# Patient Record
Sex: Female | Born: 1965
Health system: Southern US, Community
[De-identification: ages and names within clinical notes are randomized; demographics above are authoritative.]

## PROBLEM LIST (undated history)

## (undated) DIAGNOSIS — K219 Gastro-esophageal reflux disease without esophagitis: Secondary | ICD-10-CM

## (undated) DIAGNOSIS — M2550 Pain in unspecified joint: Secondary | ICD-10-CM

## (undated) DIAGNOSIS — B019 Varicella without complication: Secondary | ICD-10-CM

## (undated) DIAGNOSIS — J349 Unspecified disorder of nose and nasal sinuses: Secondary | ICD-10-CM

## (undated) DIAGNOSIS — G4733 Obstructive sleep apnea (adult) (pediatric): Secondary | ICD-10-CM

## (undated) DIAGNOSIS — M549 Dorsalgia, unspecified: Secondary | ICD-10-CM

## (undated) HISTORY — DX: Dorsalgia, unspecified: M54.9

## (undated) HISTORY — DX: Pain in unspecified joint: M25.50

## (undated) HISTORY — DX: Unspecified disorder of nose and nasal sinuses: J34.9

## (undated) HISTORY — PX: WRIST FRACTURE SURGERY: SHX121

## (undated) HISTORY — DX: Gastro-esophageal reflux disease without esophagitis: K21.9

## (undated) HISTORY — PX: BACK SURGERY: SHX140

## (undated) HISTORY — DX: Obstructive sleep apnea (adult) (pediatric): G47.33

## (undated) HISTORY — DX: Varicella without complication: B01.9

---

## 2010-11-27 ENCOUNTER — Encounter
Admission: RE | Admit: 2010-11-27 | Discharge: 2010-11-27 | Payer: Self-pay | Source: Home / Self Care | Attending: Family Medicine | Admitting: Family Medicine

## 2011-06-19 ENCOUNTER — Encounter (HOSPITAL_COMMUNITY)
Admission: RE | Admit: 2011-06-19 | Discharge: 2011-06-19 | Disposition: A | Discharge status: Home / Self Care | Payer: Federal, State, Local not specified - PPO | Source: Ambulatory Visit | Attending: Obstetrics and Gynecology | Admitting: Obstetrics and Gynecology

## 2011-06-19 ENCOUNTER — Encounter (HOSPITAL_COMMUNITY): Payer: Self-pay

## 2011-06-19 LAB — CBC
HCT: 35.4 % — ABNORMAL LOW (ref 36.0–46.0)
MCV: 85.1 fL (ref 78.0–100.0)
Platelets: 228 10*3/uL (ref 150–400)
RBC: 4.16 MIL/uL (ref 3.87–5.11)
WBC: 6.4 10*3/uL (ref 4.0–10.5)

## 2011-06-19 LAB — SURGICAL PCR SCREEN: Staphylococcus aureus: NEGATIVE

## 2011-06-19 NOTE — Patient Instructions (Signed)
18 Victoria Caldwell  06/19/2011   Your procedure is scheduled on:  06/26/11  Report to [facility] at 0800 AM.  Call this number if you have problems the morning of surgery: [phone]   Remember:   Do not eat food:After Midnight.  Do not drink clear liquids: After Midnight.  Take these medicines the morning of surgery with A SIP OF WATER: none   Do not wear jewelry, make-up or nail polish.  Do not wear lotions, powders, or perfumes. You may wear deodorant.  Do not shave 48 hours prior to surgery.  Do not bring valuables to the hospital.  Contacts, dentures or bridgework may not be worn into surgery.  Leave suitcase in the car. After surgery it may be brought to your room.  For patients admitted to the hospital, checkout time is 11:00 AM the day of discharge.   Patients discharged the day of surgery will not be allowed to drive home.  Name and phone number of your driver: [phone]  Special Instructions: CHG Shower Use Special Wash: 1/2 bottle night before surgery and 1/2 bottle morning of surgery.   Please read over the following fact sheets that you were given:

## 2011-06-25 MED ORDER — CEFAZOLIN SODIUM-DEXTROSE 2-3 GM-% IV SOLR
2.0000 g | INTRAVENOUS | Status: AC
  Administered 2011-06-26: 2 g via INTRAVENOUS
  Filled 2011-06-25: qty 50

## 2011-06-25 NOTE — H&P (Addendum)
Victoria Caldwell is an 45 y.o. female. G3P3 with a long h/o menorrhagia and dysmenorrhea.  Workup revealed a uterus with a large submucosal fibroid (4 cm) and a normal endometrial biopsy.  Pt wishes for definitive surgical therapy with a hysterectomy.  She is not currently sexually active and has never had any issues with abnormal pap smears.  Her periods are regular every 25-28 days and last 6 days with 3 of those severe enough to require narcotic relief and Aleve.  Pertinent Gynecological History: Last mammogram: normal Date: 2012 Last pap: normal Date: 2012 OB History: G3, P3 NSVD x 3     Past Medical History  Diagnosis Date  . No pertinent past medical history     Past Surgical History  Procedure Date  . Back surgery      Social History:  reports that she has never smoked. She does not have any smokeless tobacco history on file. She reports that she drinks about .6 ounces of alcohol per week. She reports that she does not use illicit drugs.  Allergies: No Known Allergies  No prescriptions prior to admission    ROS  There were no vitals taken for this visit. Physical Exam  Constitutional: She is oriented to person, place, and time. She appears well-developed.  Cardiovascular: Normal rate and regular rhythm.   Respiratory: Effort normal and breath sounds normal.  GI: Soft. Bowel sounds are normal.  Genitourinary: Vagina normal.       Uterus ULN adnexa without masses  Neurological: She is alert and oriented to person, place, and time.  Psychiatric: She has a normal mood and affect.      Assessment/Plan: The pt was counselled re: options and would like to proceed with an LSH with TAH in the event of complications.  We discused risks of bleeding, infection and possible damage to bowel and bladder.  She understands in the event of a complication she would need a larger incision and possible have a longer recovery.  She will do a bowel prep the night prior to surgery.  Pt  desires to proceed.  Oliver Pila 06/25/2011, 10:19 PM  Per pt no changes in dictated H&P except she has decided she definitively wants removal of her ovaries due to a h/o cancer in her mother.  She realizes this will make her menopausal and is prepared for that.  We have added this to the consent form.

## 2011-06-26 ENCOUNTER — Ambulatory Visit (HOSPITAL_COMMUNITY)
Admission: RE | Admit: 2011-06-26 | Discharge: 2011-06-27 | Disposition: A | Discharge status: Home / Self Care | Payer: Federal, State, Local not specified - PPO | Source: Ambulatory Visit | Attending: Obstetrics and Gynecology | Admitting: Obstetrics and Gynecology

## 2011-06-26 ENCOUNTER — Encounter (HOSPITAL_COMMUNITY)
Admission: RE | Disposition: A | Discharge status: Home / Self Care | Payer: Self-pay | Source: Ambulatory Visit | Attending: Obstetrics and Gynecology

## 2011-06-26 ENCOUNTER — Encounter (HOSPITAL_COMMUNITY): Payer: Self-pay | Admitting: *Deleted

## 2011-06-26 ENCOUNTER — Encounter (HOSPITAL_COMMUNITY): Payer: Self-pay | Admitting: Registered Nurse

## 2011-06-26 ENCOUNTER — Other Ambulatory Visit: Payer: Self-pay | Admitting: Obstetrics and Gynecology

## 2011-06-26 ENCOUNTER — Ambulatory Visit (HOSPITAL_COMMUNITY): Payer: Federal, State, Local not specified - PPO | Admitting: Registered Nurse

## 2011-06-26 DIAGNOSIS — N8300 Follicular cyst of ovary, unspecified side: Secondary | ICD-10-CM | POA: Insufficient documentation

## 2011-06-26 DIAGNOSIS — Z01812 Encounter for preprocedural laboratory examination: Secondary | ICD-10-CM | POA: Insufficient documentation

## 2011-06-26 DIAGNOSIS — N949 Unspecified condition associated with female genital organs and menstrual cycle: Secondary | ICD-10-CM | POA: Insufficient documentation

## 2011-06-26 DIAGNOSIS — N946 Dysmenorrhea, unspecified: Secondary | ICD-10-CM | POA: Insufficient documentation

## 2011-06-26 DIAGNOSIS — Z90710 Acquired absence of both cervix and uterus: Secondary | ICD-10-CM

## 2011-06-26 DIAGNOSIS — D259 Leiomyoma of uterus, unspecified: Secondary | ICD-10-CM | POA: Insufficient documentation

## 2011-06-26 DIAGNOSIS — Z01818 Encounter for other preprocedural examination: Secondary | ICD-10-CM | POA: Insufficient documentation

## 2011-06-26 DIAGNOSIS — N938 Other specified abnormal uterine and vaginal bleeding: Secondary | ICD-10-CM | POA: Insufficient documentation

## 2011-06-26 HISTORY — PX: LAPAROSCOPIC SUPRACERVICAL HYSTERECTOMY: SHX5399

## 2011-06-26 HISTORY — PX: SALPINGOOPHORECTOMY: SHX82

## 2011-06-26 LAB — TYPE AND SCREEN
ABO/RH(D): O POS
Antibody Screen: NEGATIVE

## 2011-06-26 SURGERY — HYSTERECTOMY, SUPRACERVICAL, LAPAROSCOPIC
Anesthesia: General | Site: Uterus | Wound class: Clean Contaminated

## 2011-06-26 MED ORDER — ALUM & MAG HYDROXIDE-SIMETH 200-200-20 MG/5ML PO SUSP: 30.0000 mL | ORAL | Status: DC | PRN

## 2011-06-26 MED ORDER — OXYCODONE-ACETAMINOPHEN 5-325 MG PO TABS: 1.0000 | ORAL_TABLET | ORAL | Status: DC | PRN

## 2011-06-26 MED ORDER — MEPERIDINE HCL 25 MG/ML IJ SOLN: 6.2500 mg | INTRAMUSCULAR | Status: DC | PRN

## 2011-06-26 MED ORDER — LIDOCAINE HCL (CARDIAC) 20 MG/ML IV SOLN
INTRAVENOUS | Status: DC | PRN
  Administered 2011-06-26: 80 mg via INTRAVENOUS

## 2011-06-26 MED ORDER — ROCURONIUM BROMIDE 50 MG/5ML IV SOLN
INTRAVENOUS | Status: AC
  Filled 2011-06-26: qty 1

## 2011-06-26 MED ORDER — MORPHINE SULFATE 10 MG/ML IJ SOLN
INTRAMUSCULAR | Status: AC
  Filled 2011-06-26: qty 1

## 2011-06-26 MED ORDER — LIDOCAINE HCL (CARDIAC) 20 MG/ML IV SOLN
INTRAVENOUS | Status: AC
  Filled 2011-06-26: qty 5

## 2011-06-26 MED ORDER — SODIUM CHLORIDE 0.9 % IJ SOLN
INTRAMUSCULAR | Status: DC | PRN
  Administered 2011-06-26: 10 mL

## 2011-06-26 MED ORDER — KETOROLAC TROMETHAMINE 30 MG/ML IJ SOLN
30.0000 mg | Freq: Four times a day (QID) | INTRAMUSCULAR | Status: AC
  Administered 2011-06-26 (×2): 30 mg via INTRAVENOUS
  Filled 2011-06-26 (×3): qty 1

## 2011-06-26 MED ORDER — ONDANSETRON HCL 4 MG/2ML IJ SOLN: 4.0000 mg | Freq: Once | INTRAMUSCULAR | Status: DC | PRN

## 2011-06-26 MED ORDER — MORPHINE SULFATE 10 MG/ML IJ SOLN
INTRAMUSCULAR | Status: DC | PRN
  Administered 2011-06-26 (×2): 5 mg via INTRAVENOUS

## 2011-06-26 MED ORDER — BISACODYL 10 MG RE SUPP: 10.0000 mg | Freq: Every day | RECTAL | Status: DC | PRN

## 2011-06-26 MED ORDER — DOCUSATE SODIUM 100 MG PO CAPS
100.0000 mg | ORAL_CAPSULE | Freq: Every day | ORAL | Status: DC
  Administered 2011-06-27: 100 mg via ORAL
  Filled 2011-06-26: qty 1

## 2011-06-26 MED ORDER — ONDANSETRON HCL 4 MG/2ML IJ SOLN
INTRAMUSCULAR | Status: DC | PRN
  Administered 2011-06-26: 4 mg via INTRAVENOUS

## 2011-06-26 MED ORDER — DEXTROSE-NACL 5-0.45 % IV SOLN
INTRAVENOUS | Status: DC
  Administered 2011-06-26 – 2011-06-27 (×2): via INTRAVENOUS

## 2011-06-26 MED ORDER — ONDANSETRON HCL 4 MG PO TABS: 4.0000 mg | ORAL_TABLET | Freq: Four times a day (QID) | ORAL | Status: DC | PRN

## 2011-06-26 MED ORDER — LACTATED RINGERS IV SOLN
INTRAVENOUS | Status: DC
  Administered 2011-06-26: 10:00:00 via INTRAVENOUS
  Administered 2011-06-26: 1000 mL via INTRAVENOUS
  Administered 2011-06-26 (×2): via INTRAVENOUS

## 2011-06-26 MED ORDER — KETOROLAC TROMETHAMINE 30 MG/ML IJ SOLN
INTRAMUSCULAR | Status: DC | PRN
  Administered 2011-06-26: 30 mg via INTRAVENOUS

## 2011-06-26 MED ORDER — ONDANSETRON HCL 4 MG/2ML IJ SOLN
INTRAMUSCULAR | Status: AC
  Filled 2011-06-26: qty 2

## 2011-06-26 MED ORDER — MIDAZOLAM HCL 2 MG/2ML IJ SOLN
INTRAMUSCULAR | Status: AC
Start: 2011-06-26 — End: 2011-06-26
  Filled 2011-06-26: qty 2

## 2011-06-26 MED ORDER — GLYCOPYRROLATE 0.2 MG/ML IJ SOLN
INTRAMUSCULAR | Status: AC
  Filled 2011-06-26: qty 1

## 2011-06-26 MED ORDER — GLYCOPYRROLATE 0.2 MG/ML IJ SOLN
INTRAMUSCULAR | Status: DC | PRN
  Administered 2011-06-26 (×2): 0.2 mg via INTRAVENOUS

## 2011-06-26 MED ORDER — FENTANYL CITRATE 0.05 MG/ML IJ SOLN
INTRAMUSCULAR | Status: AC
  Filled 2011-06-26: qty 5

## 2011-06-26 MED ORDER — GLYCOPYRROLATE 0.2 MG/ML IJ SOLN
INTRAMUSCULAR | Status: AC
  Administered 2011-06-26: 0.2 mg via INTRAVENOUS
  Filled 2011-06-26: qty 1

## 2011-06-26 MED ORDER — DEXAMETHASONE SODIUM PHOSPHATE 10 MG/ML IJ SOLN
INTRAMUSCULAR | Status: DC | PRN
  Administered 2011-06-26: 10 mg via INTRAVENOUS

## 2011-06-26 MED ORDER — NEOSTIGMINE METHYLSULFATE 1 MG/ML IJ SOLN
INTRAMUSCULAR | Status: DC | PRN
  Administered 2011-06-26: 1.5 mg via INTRAMUSCULAR

## 2011-06-26 MED ORDER — PROPOFOL 10 MG/ML IV EMUL
INTRAVENOUS | Status: DC | PRN
  Administered 2011-06-26: 200 mg via INTRAVENOUS

## 2011-06-26 MED ORDER — ONDANSETRON HCL 4 MG/2ML IJ SOLN
4.0000 mg | Freq: Four times a day (QID) | INTRAMUSCULAR | Status: DC | PRN
  Administered 2011-06-26 – 2011-06-27 (×3): 4 mg via INTRAVENOUS
  Filled 2011-06-26 (×3): qty 2

## 2011-06-26 MED ORDER — ROCURONIUM BROMIDE 100 MG/10ML IV SOLN
INTRAVENOUS | Status: DC | PRN
  Administered 2011-06-26: 50 mg via INTRAVENOUS

## 2011-06-26 MED ORDER — HYDROMORPHONE HCL 1 MG/ML IJ SOLN: 0.2500 mg | INTRAMUSCULAR | Status: DC | PRN

## 2011-06-26 MED ORDER — SIMETHICONE 80 MG PO CHEW: 80.0000 mg | CHEWABLE_TABLET | Freq: Four times a day (QID) | ORAL | Status: DC | PRN

## 2011-06-26 MED ORDER — MIDAZOLAM HCL 5 MG/5ML IJ SOLN
INTRAMUSCULAR | Status: DC | PRN
  Administered 2011-06-26: 2 mg via INTRAVENOUS

## 2011-06-26 MED ORDER — PROPOFOL 10 MG/ML IV EMUL
INTRAVENOUS | Status: AC
Start: 2011-06-26 — End: 2011-06-26
  Filled 2011-06-26: qty 20

## 2011-06-26 MED ORDER — FENTANYL CITRATE 0.05 MG/ML IJ SOLN
INTRAMUSCULAR | Status: DC | PRN
  Administered 2011-06-26: 100 ug via INTRAVENOUS
  Administered 2011-06-26 (×4): 50 ug via INTRAVENOUS

## 2011-06-26 MED ORDER — IBUPROFEN 600 MG PO TABS: 600.0000 mg | ORAL_TABLET | Freq: Four times a day (QID) | ORAL | Status: DC | PRN

## 2011-06-26 MED ORDER — BUPIVACAINE HCL (PF) 0.25 % IJ SOLN
INTRAMUSCULAR | Status: DC | PRN
  Administered 2011-06-26: 10 mL

## 2011-06-26 MED ORDER — NEOSTIGMINE METHYLSULFATE 1 MG/ML IJ SOLN
INTRAMUSCULAR | Status: AC
  Filled 2011-06-26: qty 10

## 2011-06-26 MED ORDER — HYDROMORPHONE HCL 1 MG/ML IJ SOLN: 1.0000 mg | INTRAMUSCULAR | Status: DC | PRN

## 2011-06-26 MED ORDER — GLYCOPYRROLATE 0.2 MG/ML IJ SOLN
0.2000 mg | Freq: Once | INTRAMUSCULAR | Status: AC
  Administered 2011-06-26: 0.2 mg via INTRAVENOUS

## 2011-06-26 MED ORDER — KETOROLAC TROMETHAMINE 30 MG/ML IJ SOLN: 30.0000 mg | Freq: Four times a day (QID) | INTRAMUSCULAR | Status: AC

## 2011-06-26 MED ORDER — KETOROLAC TROMETHAMINE 30 MG/ML IJ SOLN
INTRAMUSCULAR | Status: AC
  Filled 2011-06-26: qty 1

## 2011-06-26 MED ORDER — KETOROLAC TROMETHAMINE 30 MG/ML IJ SOLN: 15.0000 mg | Freq: Once | INTRAMUSCULAR | Status: DC | PRN

## 2011-06-26 MED ORDER — LACTATED RINGERS IR SOLN
Status: DC | PRN
  Administered 2011-06-26: 3000 mL

## 2011-06-26 MED ORDER — DEXAMETHASONE SODIUM PHOSPHATE 10 MG/ML IJ SOLN
INTRAMUSCULAR | Status: AC
  Filled 2011-06-26: qty 1

## 2011-06-26 SURGICAL SUPPLY — 30 items
BARRIER ADHS 3X4 INTERCEED (GAUZE/BANDAGES/DRESSINGS) ×3 IMPLANT
BLADE LAPAROSCOPIC MORCELL KIT (BLADE) ×6 IMPLANT
BLADE SURG 10 STRL SS (BLADE) ×3 IMPLANT
CHLORAPREP W/TINT 26ML (MISCELLANEOUS) ×3 IMPLANT
CLOTH BEACON ORANGE TIMEOUT ST (SAFETY) ×3 IMPLANT
COVER MAYO STAND STRL (DRAPES) ×3 IMPLANT
DERMABOND ADVANCED (GAUZE/BANDAGES/DRESSINGS) ×3 IMPLANT
DRAPE UTILITY XL STRL (DRAPES) ×3 IMPLANT
EVACUATOR SMOKE 8.L (FILTER) ×6 IMPLANT
GLOVE BIO SURGEON STRL SZ 6.5 (GLOVE) ×6 IMPLANT
GLOVE BIO SURGEON STRL SZ8 (GLOVE) ×3 IMPLANT
GLOVE ORTHO TXT STRL SZ7.5 (GLOVE) ×3 IMPLANT
GOWN PREVENTION PLUS LG XLONG (DISPOSABLE) ×6 IMPLANT
GOWN PREVENTION PLUS XLARGE (GOWN DISPOSABLE) ×3 IMPLANT
NEEDLE INSUFFLATION 14GA 120MM (NEEDLE) ×3 IMPLANT
NS IRRIG 1000ML POUR BTL (IV SOLUTION) ×3 IMPLANT
PACK LAPAROSCOPY BASIN (CUSTOM PROCEDURE TRAY) ×3 IMPLANT
SCALPEL HARMONIC ACE (MISCELLANEOUS) ×3 IMPLANT
SET IRRIG TUBING LAPAROSCOPIC (IRRIGATION / IRRIGATOR) ×3 IMPLANT
SLEEVE Z-THREAD 5X100MM (TROCAR) ×3 IMPLANT
SPONGE LAP 18X18 X RAY DECT (DISPOSABLE) ×3 IMPLANT
SUT VIC AB 3-0 PS2 18 (SUTURE) ×1
SUT VIC AB 3-0 PS2 18XBRD (SUTURE) ×2 IMPLANT
SUT VICRYL 0 UR6 27IN ABS (SUTURE) ×3 IMPLANT
TOWEL OR 17X24 6PK STRL BLUE (TOWEL DISPOSABLE) ×6 IMPLANT
TRAY FOLEY CATH 14FR (SET/KITS/TRAYS/PACK) ×3 IMPLANT
TROCAR Z-THREAD FIOS 12X100MM (TROCAR) ×3 IMPLANT
TROCAR Z-THREAD FIOS 5X100MM (TROCAR) ×3 IMPLANT
WARMER LAPAROSCOPE (MISCELLANEOUS) ×3 IMPLANT
WATER STERILE IRR 1000ML POUR (IV SOLUTION) ×3 IMPLANT

## 2011-06-26 NOTE — Op Note (Signed)
Operative note  Preop diagnosis #1 fibroid uterus #2 dysmenorrhea #3 abnormal uterine bleeding  Postop diagnosis Same  Procedure Laparoscopic supracervical hysterectomy and bilateral salpingo-oophorectomy  Surgeon Dr. Huel Cote Dr. Tawanna Cooler Meisinger  Anesthesia Gen.  Fluids Estimated blood loss 300 cc Urine output 150 cc clear urine IV fluid 2200 cc LR  Findings The uterus was enlarged had a 10-12 week size with a large submucosal fibroid. Tubes and ovaries appeared normal. The remainder of the abdominal and pelvic anatomy appeared normal.  Specimen Morcellated uterus ovaries and tubes sent to pathology  Procedure note After informed consent was obtained from the patient she was taken to the operating room and general anesthesia obtained without difficulty. An appropriate time out was performed, and the patient was prepped and draped in the normal sterile fashion in the dorsal lithotomy position with her arms touch by her sides. With a Foley catheter in place,  attention was turned to the infraumbilical area which was injected with quarter percent Marcaine. A small incision was then made with the scalpel and the varies needle introduced into the peritoneal cavity. Intraperitoneal placement was confirmed with aspiration injection with normal saline and the gas flow was then applied with a normal opening pressure of 4 noted. Pneumoperitoneum was obtained with approximately 3 L of CO2 gas. The varies needle was then removed and a 5 mm trocar Opti-Vu utilized to enter the peritoneal cavity. 2 additional trochars were then placed under direct visualization a 5 mm in the upper right quadrant and a 12 mm in the upper left quadrant. With these in place the infundibulopelvic ligament was then taken down bilaterally with harmonic scalpel. This was continued down through the broad ligament and the round ligament as well with a bladder flap developed with harmonic scalpel. The uterine artery  on the patient's left was then transected with some brisk bleeding noted but ultimately controlled with Kleppinger cautery and the harmonic scalpel. The right uterine artery was likewise transected with a harmonic scalpel. When all appeared hemostatic the harmonic scalpel was utilized to come across the cervical stump. The uterine fundus was therefore completely amputated from the underlying cervical stump. There is no bleeding noted at this juncture and the 12 mm trocar was removed with the Gynecare morcellator placed within this incision. The uterus was then morcellated and removed as well as the tubes and ovaries. The abdominal cavity and pelvis were carefully inspected and any remaining fragments removed with the laparoscopic spoon. All pedicles and the cervical stump were irrigated with no active bleeding noted. The pressure was taken down to 4 with still no bleeding noted. Therefore Interceed was placed over the cervical stump, and all instruments removed from the ports. The trochars were then removed under direct visualization with no active bleeding noted. The 12 mm was removed first and direct laparoscopic visualization performed wall its port site was closed at the fascial level with 0 Vicryl. The remainder of the incisions were closed with 3-0 Vicryl in a subcuticular stitch and Dermabond on the skin. At the conclusion of the procedure all instrument and sponge counts were correct and the patient was taken to the recovery room in good condition.

## 2011-06-26 NOTE — Brief Op Note (Signed)
06/26/2011  11:22 AM  PATIENT:  Victoria Caldwell  45 y.o. female  PRE-OPERATIVE DIAGNOSIS:  Bleeding; Fibroids, Dysmenorrhea POST-OPERATIVE DIAGNOSIS: same PROCEDURE:  Procedure(s): LAPAROSCOPIC SUPRACERVICAL HYSTERECTOMY BILATERAL SALPINGO OOPHERECTOMY  SURGEON:  Surgeon(s): Milagros Reap D Meisinger   ANESTHESIA:   local and general  ESTIMATED BLOOD LOSS: 350cc  BLOOD ADMINISTERED:none  LOCAL MEDICATIONS USED: .25% Marcaine  SPECIMEN:  Uterus Tubes and Ovaries  DISPOSITION OF SPECIMEN:  PATHOLOGY  COUNTS:  YES  PATIENT DISPOSITION:  PACU - hemodynamically stable.

## 2011-06-26 NOTE — Progress Notes (Signed)
Encounter addended by: Madison Hickman on: 06/26/2011  3:44 PM<BR>     Documentation filed: Notes Section

## 2011-06-26 NOTE — Preoperative (Signed)
Beta Blockers   Reason not to administer Beta Blockers:Not Applicable 

## 2011-06-26 NOTE — Anesthesia Postprocedure Evaluation (Signed)
Anesthesia Post Note  Patient: Victoria Caldwell  Procedure(s) Performed:  LAPAROSCOPIC SUPRACERVICAL HYSTERECTOMY; SALPINGO OOPHERECTOMY  Anesthesia type: General  Patient location: PACU  Post pain: Pain level controlled  Post assessment: Post-op Vital signs reviewed  Last Vitals:  Filed Vitals:   06/26/11 1135  BP: 91/51  Pulse: 50  Temp:   Resp: 10    Post vital signs: Reviewed  Level of consciousness: sedated  Complications: No apparent anesthesia complicationsfj

## 2011-06-26 NOTE — Anesthesia Postprocedure Evaluation (Signed)
  Anesthesia Post-op Note  Patient: Victoria Caldwell  Procedure(s) Performed:  LAPAROSCOPIC SUPRACERVICAL HYSTERECTOMY; SALPINGO OOPHERECTOMY  Patient Location: [facility]  Anesthesia Type: General  Level of Consciousness: awake, alert  and oriented  Airway and Oxygen Therapy: Patient Spontanous Breathing and Patient connected to nasal cannula oxygen  Post-op Pain: none  Post-op Assessment: Post-op Vital signs reviewed and Patient's Cardiovascular Status Stable  Post-op Vital Signs: Reviewed and stable  Complications: No apparent anesthesia complications

## 2011-06-26 NOTE — Transfer of Care (Signed)
  Anesthesia Post-op Note  Patient: Victoria Caldwell  Procedure(s) Performed:  LAPAROSCOPIC SUPRACERVICAL HYSTERECTOMY; SALPINGO OOPHERECTOMY  Patient Location: PACU  Anesthesia Type: General  Level of Consciousness: sedated  Airway and Oxygen Therapy: Patient Spontanous Breathing and Patient connected to nasal cannula oxygen  Post-op Pain: none  Post-op Assessment: Post-op Vital signs reviewed  Post-op Vital Signs: Reviewed  Complications: No apparent anesthesia complications

## 2011-06-26 NOTE — Progress Notes (Signed)
DOS afebrile VS stable output good Pt is awake and alert. Her abdomen is soft and not tender.

## 2011-06-26 NOTE — Anesthesia Preprocedure Evaluation (Signed)
Anesthesia Evaluation  Name, MR# and DOB Patient awake  General Assessment Comment  Reviewed: Allergy & Precautions, H&P , NPO status , Patient's Chart, lab work & pertinent test results  Airway Mallampati: I TM Distance: >3 FB Neck ROM: full    Dental  (+) Teeth Intact   Pulmonary  clear to auscultation  pulmonary exam normalPulmonary Exam Normal breath sounds clear to auscultation none    Cardiovascular     Neuro/Psych Negative Neurological ROS  Negative Psych ROS  GI/Hepatic/Renal negative GI ROS  negative Liver ROS  negative Renal ROS        Endo/Other  Negative Endocrine ROS (+)      Abdominal Normal abdominal exam  (+)   Musculoskeletal negative musculoskeletal ROS (+)   Hematology negative hematology ROS (+)   Peds  Reproductive/Obstetrics negative OB ROS    Anesthesia Other Findings             Anesthesia Physical Anesthesia Plan  ASA: I  Anesthesia Plan: General   Post-op Pain Management:    Induction: Intravenous  Airway Management Planned: Oral ETT  Additional Equipment:   Intra-op Plan:   Post-operative Plan: Extubation in OR  Informed Consent: I have reviewed the patients History and Physical, chart, labs and discussed the procedure including the risks, benefits and alternatives for the proposed anesthesia with the patient or authorized representative who has indicated his/her understanding and acceptance.   Dental Advisory Given  Plan Discussed with: CRNA  Anesthesia Plan Comments:         Anesthesia Quick Evaluation

## 2011-06-27 LAB — CBC
MCH: 27.5 pg (ref 26.0–34.0)
Platelets: 244 10*3/uL (ref 150–400)
RBC: 3.13 MIL/uL — ABNORMAL LOW (ref 3.87–5.11)
WBC: 10.2 10*3/uL (ref 4.0–10.5)

## 2011-06-27 MED ORDER — IBUPROFEN 600 MG PO TABS: 600.0000 mg | ORAL_TABLET | Freq: Four times a day (QID) | ORAL | Status: AC | PRN

## 2011-06-27 MED ORDER — OXYCODONE-ACETAMINOPHEN 5-325 MG PO TABS
1.0000 | ORAL_TABLET | ORAL | Status: AC | PRN
Start: 2011-06-27 — End: 2011-07-07

## 2011-06-27 NOTE — Discharge Summary (Signed)
Physician Discharge Summary  Patient ID: Victoria Caldwell MRN: [id number] DOB/AGE: 04-12-1966 45 y.o.  Admit date: 06/26/2011 Discharge date: 06/27/2011  Admission Diagnoses: Fibroids Dysmenorrhea Abnormall Uterine Bleeding  Discharge Diagnoses:  Same and s/p Laparoscopic Supracervical Hysterectomy/BSO  Discharged Condition: good  Hospital Course: Pt was admitted for routine post-op observation after her surgery.  She did well and by post-op day #1 was ambulating, Tolerating a regular diet and voiding well.   Discharge Exam: Blood pressure 108/71, pulse 67, temperature 98.6 F (37 C), temperature source Oral, resp. rate 18, SpO2 100.00%. General appearance: alert Resp: clear to auscultation bilaterally Cardio: regular rate and rhythm GI: soft, non-tender; bowel sounds normal; no masses,  no organomegaly Incision/Wound:clear and well-approximated  Disposition:home  Discharge Orders    Future Orders Please Complete By Expires   Diet - low sodium heart healthy      Increase activity slowly      Discharge instructions      Comments:   Nothing in vagina for 6 weeks.  No heavy lifting for 6 weeks.  Do not drive until off pain medications. Call for appointment in 2 weeks for incision check.   Call MD for:  temperature >100.4      Call MD for:  severe uncontrolled pain      Call MD for:  persistant nausea and vomiting        Current Discharge Medication List    START taking these medications   Details  ibuprofen (ADVIL,MOTRIN) 600 MG tablet Take 1 tablet (600 mg total) by mouth every 6 (six) hours as needed for pain (mild pain). Qty: 30 tablet, Refills: 30    oxyCODONE-acetaminophen (PERCOCET) 5-325 MG per tablet Take 1-2 tablets by mouth every 3 (three) hours as needed (moderate to severe pain (when tolerating fluids)). Qty: 20 tablet, Refills: 0       Follow-up Information    Follow up with Liora Myles W. Make an appointment in 2 weeks.   Contact information:   [address], [address] [place] [address] [phone]          Signed: Oliver Pila 06/27/2011, 8:53 AM

## 2011-06-27 NOTE — Progress Notes (Signed)
1 Day Post-Op Procedure(s): LAPAROSCOPIC SUPRACERVICAL HYSTERECTOMY SALPINGO OOPHERECTOMY  Subjective: Patient reports tolerating PO, had nausea and gas pain last pm.  Improved today. Has required minimal pain meds  Objective: I have reviewed patient's vital signs, intake and output and labs.  General: alert Resp: clear to auscultation bilaterally Cardio: regular rate and rhythm GI: normal findings: soft NT Vaginal Bleeding: none  Assessment: s/p Procedure(s): LAPAROSCOPIC SUPRACERVICAL HYSTERECTOMY SALPINGO OOPHERECTOMY: progressing well  Plan: Discontinue IV fluids D/c foley Plan d/c home this pm  Motrin and percocet  LOS: 1 day    RICHARDSON,KATHY W 06/27/2011, 8:47 AM

## 2011-07-10 ENCOUNTER — Encounter (HOSPITAL_COMMUNITY): Payer: Self-pay | Admitting: Obstetrics and Gynecology

## 2015-09-12 LAB — HM MAMMOGRAPHY

## 2015-11-16 ENCOUNTER — Emergency Department (HOSPITAL_BASED_OUTPATIENT_CLINIC_OR_DEPARTMENT_OTHER)
Admission: EM | Admit: 2015-11-16 | Discharge: 2015-11-16 | Disposition: A | Discharge status: Home / Self Care | Payer: Federal, State, Local not specified - PPO | Attending: Emergency Medicine | Admitting: Emergency Medicine

## 2015-11-16 ENCOUNTER — Encounter (HOSPITAL_BASED_OUTPATIENT_CLINIC_OR_DEPARTMENT_OTHER): Payer: Self-pay | Admitting: *Deleted

## 2015-11-16 DIAGNOSIS — R05 Cough: Secondary | ICD-10-CM | POA: Diagnosis not present

## 2015-11-16 DIAGNOSIS — L03211 Cellulitis of face: Secondary | ICD-10-CM | POA: Diagnosis not present

## 2015-11-16 DIAGNOSIS — H109 Unspecified conjunctivitis: Secondary | ICD-10-CM | POA: Diagnosis not present

## 2015-11-16 DIAGNOSIS — L03213 Periorbital cellulitis: Secondary | ICD-10-CM

## 2015-11-16 DIAGNOSIS — H05012 Cellulitis of left orbit: Secondary | ICD-10-CM | POA: Insufficient documentation

## 2015-11-16 DIAGNOSIS — H5712 Ocular pain, left eye: Secondary | ICD-10-CM | POA: Diagnosis present

## 2015-11-16 MED ORDER — AMOXICILLIN-POT CLAVULANATE 875-125 MG PO TABS: 1.0000 | ORAL_TABLET | Freq: Two times a day (BID) | ORAL | Status: DC

## 2015-11-16 MED ORDER — PREDNISONE 50 MG PO TABS
60.0000 mg | ORAL_TABLET | Freq: Once | ORAL | Status: AC
  Administered 2015-11-16: 60 mg via ORAL
  Filled 2015-11-16: qty 1

## 2015-11-16 MED ORDER — TOBRAMYCIN-DEXAMETHASONE 0.3-0.1 % OP SUSP: 1.0000 [drp] | OPHTHALMIC | Status: DC

## 2015-11-16 MED FILL — AMOX-CLAV 875-125 MG TABLET: 875-125 | 7 days supply | Qty: 14 | Fill #0

## 2015-11-16 MED FILL — TOBRAMYCIN-DEXAMETH OPTH SU: 0.3-0.1 | 10 days supply | Qty: 5 | Fill #0

## 2015-11-16 NOTE — ED Notes (Signed)
C/o swelling to bil eyes left greater than right with sneezing and coughing NP.

## 2015-11-16 NOTE — ED Provider Notes (Signed)
CSN: [id number]     Arrival date & time 11/16/15  1039 History   First MD Initiated Contact with Patient 11/16/15 1052     No chief complaint on file.    HPI  Patient presents for evaluation of eye pain. She's had redness and clear tearing from both eyes and some lid swelling left greater than right last 24-48 hours. Has had a cough and sneezing and runny nose. Takes Allegra at home is been compliant with it. No fevers or chills.  Past Medical History  Diagnosis Date  . No pertinent past medical history    Past Surgical History  Procedure Laterality Date  . Back surgery    . Laparoscopic supracervical hysterectomy  06/26/2011    Procedure: LAPAROSCOPIC SUPRACERVICAL HYSTERECTOMY;  Surgeon: Logan Bores;  Location: [facility];  Service: Gynecology;  Laterality: N/A;  . Salpingoophorectomy  06/26/2011    Procedure: SALPINGO OOPHERECTOMY;  Surgeon: Logan Bores;  Location: [facility];  Service: Gynecology;  Laterality: Bilateral;   No family history on file. Social History  Substance Use Topics  . Smoking status: Never Smoker   . Smokeless tobacco: None  . Alcohol Use: 0.6 oz/week    1 Glasses of wine per week     Comment: daily   OB History    No data available     Review of Systems  Constitutional: Negative for fever, chills, diaphoresis, appetite change and fatigue.  HENT: Negative for mouth sores, sore throat and trouble swallowing.        Left upper lid swelling.  Eyes: Positive for photophobia, pain, discharge, redness and itching. Negative for visual disturbance.  Respiratory: Positive for cough. Negative for chest tightness, shortness of breath and wheezing.   Cardiovascular: Negative for chest pain.  Gastrointestinal: Negative for nausea, vomiting, abdominal pain, diarrhea and abdominal distention.  Endocrine: Negative for polydipsia, polyphagia and polyuria.  Genitourinary: Negative for dysuria, frequency and hematuria.  Musculoskeletal: Negative for gait  problem.  Skin: Negative for color change, pallor and rash.  Neurological: Negative for dizziness, syncope, light-headedness and headaches.  Hematological: Does not bruise/bleed easily.  Psychiatric/Behavioral: Negative for behavioral problems and confusion.      Allergies  Review of patient's allergies indicates no known allergies.  Home Medications   Prior to Admission medications   Medication Sig Start Date End Date Taking? Authorizing Provider  amoxicillin-clavulanate (AUGMENTIN) 875-125 MG tablet Take 1 tablet by mouth 2 (two) times daily. 11/16/15   Tanna Furry, MD  tobramycin-dexamethasone [facility]) ophthalmic solution Place 1 drop into both eyes every 4 (four) hours while awake. 11/16/15   Tanna Furry, MD   BP 139/91 mmHg  Pulse 67  Temp(Src) 97.6 F (36.4 C) (Oral)  Ht 5\' 4"  (1.626 m)  Wt 198 lb (89.812 kg)  BMI 33.97 kg/m2  SpO2 100%  LMP 05/28/2011 Physical Exam  Constitutional: She is oriented to person, place, and time. She appears well-developed and well-nourished. No distress.  HENT:  Head: Normocephalic.  Eyes: Conjunctivae are normal. Pupils are equal, round, and reactive to light. No scleral icterus.  Bilateral conjunctival injection of the bulbar, and palpebral conjunctiva. Left upper lid edema with erythema. No proptosis. Flexion ocular movements without entrapment or pain. Symmetric reactive pupils. No cell or flare in the anterior chamber. Does not have consensual photophobia to either eye.  Neck: Normal range of motion. Neck supple. No thyromegaly present.  Cardiovascular: Normal rate and regular rhythm.  Exam reveals no gallop and no friction rub.   No  murmur heard. Pulmonary/Chest: Effort normal and breath sounds normal. No respiratory distress. She has no wheezes. She has no rales.  Abdominal: Soft. Bowel sounds are normal. She exhibits no distension. There is no tenderness. There is no rebound.  Musculoskeletal: Normal range of motion.  Neurological:  She is alert and oriented to person, place, and time.  Skin: Skin is warm and dry. No rash noted.  Psychiatric: She has a normal mood and affect. Her behavior is normal.    ED Course  Procedures (including critical care time) Labs Review Labs Reviewed - No data to display  Imaging Review No results found. I have personally reviewed and evaluated these images and lab results as part of my medical decision-making.   EKG Interpretation None      MDM   Final diagnoses:  Bilateral conjunctivitis  Periorbital cellulitis of left eye  Cellulitis of face    Patient more symptomatic on the left with supraorbital lid erythema. Some light sensitivity. Normal reactive pupils. No obvious cell or flare in the anterior chamber to suggest iritis. Does not have proptosis or visual acuity loss.  Right sensitivity but no visual loss and reactive pupils. No proptosis or visual loss to suggest post-septal extension. Plan Augmentin, chem ice and drops. Single dose prednisone here. Recheck with any new or worsening symptoms.    Tanna Furry, MD 11/16/15 [phone]

## 2015-11-16 NOTE — ED Notes (Signed)
Pt refused vs to be retaken on discharge.

## 2015-11-16 NOTE — Discharge Instructions (Signed)
Bacterial Conjunctivitis Bacterial conjunctivitis, commonly called pink eye, is an inflammation of the clear membrane that covers the white part of the eye (conjunctiva). The inflammation can also happen on the underside of the eyelids. The blood vessels in the conjunctiva become inflamed, causing the eye to become red or pink. Bacterial conjunctivitis may spread easily from one eye to another and from person to person (contagious).  CAUSES  Bacterial conjunctivitis is caused by bacteria. The bacteria may come from your own skin, your upper respiratory tract, or from someone else with bacterial conjunctivitis. SYMPTOMS  The normally white color of the eye or the underside of the eyelid is usually pink or red. The pink eye is usually associated with irritation, tearing, and some sensitivity to light. Bacterial conjunctivitis is often associated with a thick, yellowish discharge from the eye. The discharge may turn into a crust on the eyelids overnight, which causes your eyelids to stick together. If a discharge is present, there may also be some blurred vision in the affected eye. DIAGNOSIS  Bacterial conjunctivitis is diagnosed by your caregiver through an eye exam and the symptoms that you report. Your caregiver looks for changes in the surface tissues of your eyes, which may point to the specific type of conjunctivitis. A sample of any discharge may be collected on a cotton-tip swab if you have a severe case of conjunctivitis, if your cornea is affected, or if you keep getting repeat infections that do not respond to treatment. The sample will be sent to a lab to see if the inflammation is caused by a bacterial infection and to see if the infection will respond to antibiotic medicines. TREATMENT   Bacterial conjunctivitis is treated with antibiotics. Antibiotic eyedrops are most often used. However, antibiotic ointments are also available. Antibiotics pills are sometimes used. Artificial tears or eye  washes may ease discomfort. HOME CARE INSTRUCTIONS   To ease discomfort, apply a cool, clean washcloth to your eye for 10-20 minutes, 3-4 times a day.  Gently wipe away any drainage from your eye with a warm, wet washcloth or a cotton ball.  Wash your hands often with soap and water. Use paper towels to dry your hands.  Do not share towels or washcloths. This may spread the infection.  Change or wash your pillowcase every day.  You should not use eye makeup until the infection is gone.  Do not operate machinery or drive if your vision is blurred.  Stop using contact lenses. Ask your caregiver how to sterilize or replace your contacts before using them again. This depends on the type of contact lenses that you use.  When applying medicine to the infected eye, do not touch the edge of your eyelid with the eyedrop bottle or ointment tube. SEEK IMMEDIATE MEDICAL CARE IF:   Your infection has not improved within 3 days after beginning treatment.  You had yellow discharge from your eye and it returns.  You have increased eye pain.  Your eye redness is spreading.  Your vision becomes blurred.  You have a fever or persistent symptoms for more than 2-3 days.  You have a fever and your symptoms suddenly get worse.  You have facial pain, redness, or swelling. MAKE SURE YOU:   Understand these instructions.  Will watch your condition.  Will get help right away if you are not doing well or get worse.   This information is not intended to replace advice given to you by your health care provider. Make sure you   discuss any questions you have with your health care provider.   Document Released: 10/15/2005 Document Revised: 11/05/2014 Document Reviewed: 03/17/2012 Elsevier Interactive Patient Education 2016 [employer]  Cellulitis Cellulitis is an infection of the skin and the tissue beneath it. The infected area is usually red and tender. Cellulitis occurs most often in the arms  and lower legs.  CAUSES  Cellulitis is caused by bacteria that enter the skin through cracks or cuts in the skin. The most common types of bacteria that cause cellulitis are staphylococci and streptococci. SIGNS AND SYMPTOMS   Redness and warmth.  Swelling.  Tenderness or pain.  Fever. DIAGNOSIS  Your health care provider can usually determine what is wrong based on a physical exam. Blood tests may also be done. TREATMENT  Treatment usually involves taking an antibiotic medicine. HOME CARE INSTRUCTIONS   Take your antibiotic medicine as directed by your health care provider. Finish the antibiotic even if you start to feel better.  Keep the infected arm or leg elevated to reduce swelling.  Apply a warm cloth to the affected area up to 4 times per day to relieve pain.  Take medicines only as directed by your health care provider.  Keep all follow-up visits as directed by your health care provider. SEEK MEDICAL CARE IF:   You notice red streaks coming from the infected area.  Your red area gets larger or turns dark in color.  Your bone or joint underneath the infected area becomes painful after the skin has healed.  Your infection returns in the same area or another area.  You notice a swollen bump in the infected area.  You develop new symptoms.  You have a fever. SEEK IMMEDIATE MEDICAL CARE IF:   You feel very sleepy.  You develop vomiting or diarrhea.  You have a general ill feeling (malaise) with muscle aches and pains.   This information is not intended to replace advice given to you by your health care provider. Make sure you discuss any questions you have with your health care provider.   Document Released: 07/25/2005 Document Revised: 07/06/2015 Document Reviewed: 12/31/2011 Elsevier Interactive Patient Education [employer].

## 2015-12-26 ENCOUNTER — Telehealth: Payer: Self-pay | Admitting: Family Medicine

## 2015-12-26 NOTE — Telephone Encounter (Signed)
Patient scheduled to come in for a New Patient appointment on Friday 3/3 @ 3:30. Have attempted to contact patient for over a week to reschedule due to provider being out on that day. Patient does not work at the work number any longer and the home phone continues to ring busy.

## 2015-12-29 ENCOUNTER — Telehealth: Payer: Self-pay | Admitting: *Deleted

## 2015-12-29 NOTE — Telephone Encounter (Signed)
Unable to reach patient at time of pre-visit call. Line busy, unable to leave message.

## 2015-12-30 ENCOUNTER — Encounter: Payer: Self-pay | Admitting: Family Medicine

## 2015-12-30 ENCOUNTER — Ambulatory Visit (INDEPENDENT_AMBULATORY_CARE_PROVIDER_SITE_OTHER): Payer: Federal, State, Local not specified - PPO | Admitting: Family Medicine

## 2015-12-30 ENCOUNTER — Other Ambulatory Visit (HOSPITAL_COMMUNITY)
Admission: RE | Admit: 2015-12-30 | Discharge: 2015-12-30 | Disposition: A | Discharge status: Home / Self Care | Payer: Federal, State, Local not specified - PPO | Source: Ambulatory Visit | Attending: Family Medicine | Admitting: Family Medicine

## 2015-12-30 VITALS — BP 122/80 | HR 66 | Temp 98.0°F | Ht 64.0 in | Wt 200.4 lb

## 2015-12-30 DIAGNOSIS — Z124 Encounter for screening for malignant neoplasm of cervix: Secondary | ICD-10-CM | POA: Diagnosis not present

## 2015-12-30 DIAGNOSIS — Z1151 Encounter for screening for human papillomavirus (HPV): Secondary | ICD-10-CM | POA: Diagnosis present

## 2015-12-30 DIAGNOSIS — Z0000 Encounter for general adult medical examination without abnormal findings: Secondary | ICD-10-CM

## 2015-12-30 DIAGNOSIS — Z01411 Encounter for gynecological examination (general) (routine) with abnormal findings: Secondary | ICD-10-CM | POA: Diagnosis present

## 2015-12-30 DIAGNOSIS — Z114 Encounter for screening for human immunodeficiency virus [HIV]: Secondary | ICD-10-CM | POA: Diagnosis not present

## 2015-12-30 DIAGNOSIS — Z1159 Encounter for screening for other viral diseases: Secondary | ICD-10-CM | POA: Diagnosis not present

## 2015-12-30 LAB — COMPREHENSIVE METABOLIC PANEL
ALK PHOS: 60 U/L (ref 33–130)
ALT: 13 U/L (ref 6–29)
AST: 14 U/L (ref 10–35)
Albumin: 4.4 g/dL (ref 3.6–5.1)
BUN: 12 mg/dL (ref 7–25)
CALCIUM: 9.2 mg/dL (ref 8.6–10.4)
CHLORIDE: 103 mmol/L (ref 98–110)
CO2: 27 mmol/L (ref 20–31)
Creat: 0.74 mg/dL (ref 0.50–1.05)
GLUCOSE: 87 mg/dL (ref 65–99)
POTASSIUM: 4 mmol/L (ref 3.5–5.3)
Sodium: 139 mmol/L (ref 135–146)
Total Bilirubin: 0.4 mg/dL (ref 0.2–1.2)
Total Protein: 7.6 g/dL (ref 6.1–8.1)

## 2015-12-30 LAB — CBC WITH DIFFERENTIAL/PLATELET
BASOS PCT: 0 % (ref 0–1)
Basophils Absolute: 0 10*3/uL (ref 0.0–0.1)
Eosinophils Absolute: 0.1 10*3/uL (ref 0.0–0.7)
Eosinophils Relative: 1 % (ref 0–5)
HCT: 39.9 % (ref 36.0–46.0)
Hemoglobin: 12.9 g/dL (ref 12.0–15.0)
LYMPHS ABS: 3.7 10*3/uL (ref 0.7–4.0)
LYMPHS PCT: 37 % (ref 12–46)
MCH: 27.5 pg (ref 26.0–34.0)
MCHC: 32.3 g/dL (ref 30.0–36.0)
MCV: 85.1 fL (ref 78.0–100.0)
MONO ABS: 0.8 10*3/uL (ref 0.1–1.0)
MONOS PCT: 8 % (ref 3–12)
MPV: 10.8 fL (ref 8.6–12.4)
NEUTROS ABS: 5.3 10*3/uL (ref 1.7–7.7)
NEUTROS PCT: 54 % (ref 43–77)
PLATELETS: 258 10*3/uL (ref 150–400)
RBC: 4.69 MIL/uL (ref 3.87–5.11)
RDW: 15.2 % (ref 11.5–15.5)
WBC: 9.9 10*3/uL (ref 4.0–10.5)

## 2015-12-30 LAB — POCT URINALYSIS DIPSTICK
GLUCOSE UA: NEGATIVE
KETONES UA: NEGATIVE
Leukocytes, UA: NEGATIVE
Nitrite, UA: NEGATIVE
Protein, UA: NEGATIVE
RBC UA: NEGATIVE
SPEC GRAV UA: 1.015
UROBILINOGEN UA: NEGATIVE
pH, UA: 6

## 2015-12-30 LAB — LIPID PANEL
CHOL/HDL RATIO: 2.6 ratio (ref <=5.0)
Cholesterol: 177 mg/dL (ref 125–200)
HDL: 67 mg/dL (ref >=46)
LDL CALC: 95 mg/dL (ref <130)
Triglycerides: 76 mg/dL (ref <150)
VLDL: 15 mg/dL (ref <30)

## 2015-12-30 LAB — TSH: TSH: 2.04 m[IU]/L

## 2015-12-30 LAB — HIV ANTIBODY (ROUTINE TESTING W REFLEX): HIV: NONREACTIVE

## 2015-12-30 NOTE — Progress Notes (Signed)
Pre visit review using our clinic review tool, if applicable. No additional management support is needed unless otherwise documented below in the visit note. 

## 2015-12-30 NOTE — Progress Notes (Signed)
Subjective:     Victoria Caldwell is a 50 y.o. female and is here for a comprehensive physical exam. The patient reports no problems.  Social History   Social History  . Marital Status: Married    Spouse Name: N/A  . Number of Children: N/A  . Years of Education: N/A   Occupational History  . Not on file.   Social History Main Topics  . Smoking status: Never Smoker   . Smokeless tobacco: Not on file  . Alcohol Use: 0.6 oz/week    1 Glasses of wine per week     Comment: daily  . Drug Use: No  . Sexual Activity: Not on file   Other Topics Concern  . Not on file   Social History Narrative   Health Maintenance  Topic Date Due  . Hepatitis C Screening  11/27/1965  . HIV Screening  06/07/1979  . PAP SMEAR  06/06/1985  . COLONOSCOPY  06/06/2014  . INFLUENZA VACCINE  05/29/2016  . MAMMOGRAM  09/11/2017  . TETANUS/TDAP  10/29/2020    The following portions of the patient's history were reviewed and updated as appropriate:  She  has a past medical history of No pertinent past medical history and Chickenpox. She  does not have a problem list on file. She  has past surgical history that includes Back surgery; Laparoscopic supracervical hysterectomy (06/26/2011); and Salpingoophorectomy (06/26/2011). Her family history is not on file. She  reports that she has never smoked. She does not have any smokeless tobacco history on file. She reports that she drinks about 0.6 oz of alcohol per week. She reports that she does not use illicit drugs. She currently has no medications in their medication list. No current outpatient prescriptions on file prior to visit.   No current facility-administered medications on file prior to visit.   She has No Known Allergies..  Review of Systems Review of Systems  Constitutional: Negative for activity change, appetite change and fatigue.  HENT: Negative for hearing loss, congestion, tinnitus and ear discharge.  dentist [id number]: Negative for  visual disturbance (see optho q1y -- vision corrected to 20/20 with glasses).  Respiratory: Negative for cough, chest tightness and shortness of breath.   Cardiovascular: Negative for chest pain, palpitations and leg swelling.  Gastrointestinal: Negative for abdominal pain, diarrhea, constipation and abdominal distention.  Genitourinary: Negative for urgency, frequency, decreased urine volume and difficulty urinating.  Musculoskeletal: Negative for back pain, arthralgias and gait problem.  Skin: Negative for color change, pallor and rash.  Neurological: Negative for dizziness, light-headedness, numbness and headaches.  Hematological: Negative for adenopathy. Does not bruise/bleed easily.  Psychiatric/Behavioral: Negative for suicidal ideas, confusion, sleep disturbance, self-injury, dysphoric mood, decreased concentration and agitation.       Objective:    BP 122/80 mmHg  Pulse 66  Temp(Src) 98 F (36.7 C) (Oral)  Ht '5\' 4"'  (1.626 m)  Wt 200 lb 6.4 oz (90.901 kg)  BMI 34.38 kg/m2  SpO2 98%  LMP 05/28/2011 General appearance: alert, cooperative, appears stated age and no distress Head: Normocephalic, without obvious abnormality, atraumatic Eyes: conjunctivae/corneas clear. PERRL, EOM's intact. Fundi benign. Ears: normal TM's and external ear canals both ears Nose: Nares normal. Septum midline. Mucosa normal. No drainage or sinus tenderness. Throat: lips, mucosa, and tongue normal; teeth and gums normal Neck: no adenopathy, no carotid bruit, no JVD, supple, symmetrical, trachea midline and thyroid not enlarged, symmetric, no tenderness/mass/nodules Back: symmetric, no curvature. ROM normal. No CVA tenderness. Lungs: clear to  auscultation bilaterally Breasts: no nipple d/c, no dimpling no axillary nodes, no masses Heart: regular rate and rhythm, S1, S2 normal, no murmur, click, rub or gallop Abdomen: soft, non-tender; bowel sounds normal; no masses,  no organomegaly Pelvic: no ext  lesions, no errythema, no d/c, no cmt, no lesions,  Pap done Extremities: extremities normal, atraumatic, no cyanosis or edema Pulses: 2+ and symmetric Skin: Skin color, texture, turgor normal. No rashes or lesions Lymph nodes: Cervical, supraclavicular, and axillary nodes normal. Neurologic: Alert and oriented X 3, normal strength and tone. Normal symmetric reflexes. Normal coordination and gait Psych- no depression, no anxiety      Assessment:    Healthy female exam.      Plan:     check labs ghm utd  See After Visit Summary for Counseling Recommendations    1. Preventative health care See avs-- see above - POCT urinalysis dipstick - Ambulatory referral to Gastroenterology - TSH - Lipid panel - HIV antibody (with reflex) - Hepatitis C Antibody - Comp Met (CMET) - CBC with Differential  2. Need for hepatitis C screening test  - TSH - Lipid panel - HIV antibody (with reflex) - Hepatitis C Antibody - Comp Met (CMET) - CBC with Differential  3. Encounter for screening for HIV  - TSH - Lipid panel - HIV antibody (with reflex) - Hepatitis C Antibody - Comp Met (CMET) - CBC with Differential  4. Cervical cancer screening  - Cytology - PAP - TSH - Lipid panel - HIV antibody (with reflex) - Hepatitis C Antibody - Comp Met (CMET) - CBC with Differential

## 2015-12-30 NOTE — Patient Instructions (Addendum)
Preventive Care for Adults, Female A healthy lifestyle and preventive care can promote health and wellness. Preventive health guidelines for women include the following key practices.  A routine yearly physical is a good way to check with your health care provider about your health and preventive screening. It is a chance to share any concerns and updates on your health and to receive a thorough exam.  Visit your dentist for a routine exam and preventive care every 6 months. Brush your teeth twice a day and floss once a day. Good oral hygiene prevents tooth decay and gum disease.  The frequency of eye exams is based on your age, health, family medical history, use of contact lenses, and other factors. Follow your health care provider's recommendations for frequency of eye exams.  Eat a healthy diet. Foods like vegetables, fruits, whole grains, low-fat dairy products, and lean protein foods contain the nutrients you need without too many calories. Decrease your intake of foods high in solid fats, added sugars, and salt. Eat the right amount of calories for you.Get information about a proper diet from your health care provider, if necessary.  Regular physical exercise is one of the most important things you can do for your health. Most adults should get at least 150 minutes of moderate-intensity exercise (any activity that increases your heart rate and causes you to sweat) each week. In addition, most adults need muscle-strengthening exercises on 2 or more days a week.  Maintain a healthy weight. The body mass index (BMI) is a screening tool to identify possible weight problems. It provides an estimate of body fat based on height and weight. Your health care provider can find your BMI and can help you achieve or maintain a healthy weight.For adults 20 years and older:  A BMI below 18.5 is considered underweight.  A BMI of 18.5 to 24.9 is normal.  A BMI of 25 to 29.9 is considered overweight.  A  BMI of 30 and above is considered obese.  Maintain normal blood lipids and cholesterol levels by exercising and minimizing your intake of saturated fat. Eat a balanced diet with plenty of fruit and vegetables. Blood tests for lipids and cholesterol should begin at age 20 and be repeated every 5 years. If your lipid or cholesterol levels are high, you are over 50, or you are at high risk for heart disease, you may need your cholesterol levels checked more frequently.Ongoing high lipid and cholesterol levels should be treated with medicines if diet and exercise are not working.  If you smoke, find out from your health care provider how to quit. If you do not use tobacco, do not start.  Lung cancer screening is recommended for adults aged 55-80 years who are at high risk for developing lung cancer because of a history of smoking. A yearly low-dose CT scan of the lungs is recommended for people who have at least a 30-pack-year history of smoking and are a current smoker or have quit within the past 15 years. A pack year of smoking is smoking an average of 1 pack of cigarettes a day for 1 year (for example: 1 pack a day for 30 years or 2 packs a day for 15 years). Yearly screening should continue until the smoker has stopped smoking for at least 15 years. Yearly screening should be stopped for people who develop a health problem that would prevent them from having lung cancer treatment.  If you are pregnant, do not drink alcohol. If you are   breastfeeding, be very cautious about drinking alcohol. If you are not pregnant and choose to drink alcohol, do not have more than 1 drink per day. One drink is considered to be 12 ounces (355 mL) of beer, 5 ounces (148 mL) of wine, or 1.5 ounces (44 mL) of liquor.  Avoid use of street drugs. Do not share needles with anyone. Ask for help if you need support or instructions about stopping the use of drugs.  High blood pressure causes heart disease and increases the risk  of stroke. Your blood pressure should be checked at least every 1 to 2 years. Ongoing high blood pressure should be treated with medicines if weight loss and exercise do not work.  If you are 55-79 years old, ask your health care provider if you should take aspirin to prevent strokes.  Diabetes screening is done by taking a blood sample to check your blood glucose level after you have not eaten for a certain period of time (fasting). If you are not overweight and you do not have risk factors for diabetes, you should be screened once every 3 years starting at age 45. If you are overweight or obese and you are 40-70 years of age, you should be screened for diabetes every year as part of your cardiovascular risk assessment.  Breast cancer screening is essential preventive care for women. You should practice "breast self-awareness." This means understanding the normal appearance and feel of your breasts and may include breast self-examination. Any changes detected, no matter how small, should be reported to a health care provider. Women in their 20s and 30s should have a clinical breast exam (CBE) by a health care provider as part of a regular health exam every 1 to 3 years. After age 40, women should have a CBE every year. Starting at age 40, women should consider having a mammogram (breast X-ray test) every year. Women who have a family history of breast cancer should talk to their health care provider about genetic screening. Women at a high risk of breast cancer should talk to their health care providers about having an MRI and a mammogram every year.  Breast cancer gene (BRCA)-related cancer risk assessment is recommended for women who have family members with BRCA-related cancers. BRCA-related cancers include breast, ovarian, tubal, and peritoneal cancers. Having family members with these cancers may be associated with an increased risk for harmful changes (mutations) in the breast cancer genes BRCA1 and  BRCA2. Results of the assessment will determine the need for genetic counseling and BRCA1 and BRCA2 testing.  Your health care provider may recommend that you be screened regularly for cancer of the pelvic organs (ovaries, uterus, and vagina). This screening involves a pelvic examination, including checking for microscopic changes to the surface of your cervix (Pap test). You may be encouraged to have this screening done every 3 years, beginning at age 21.  For women ages 30-65, health care providers may recommend pelvic exams and Pap testing every 3 years, or they may recommend the Pap and pelvic exam, combined with testing for human papilloma virus (HPV), every 5 years. Some types of HPV increase your risk of cervical cancer. Testing for HPV may also be done on women of any age with unclear Pap test results.  Other health care providers may not recommend any screening for nonpregnant women who are considered low risk for pelvic cancer and who do not have symptoms. Ask your health care provider if a screening pelvic exam is right for   you.  If you have had past treatment for cervical cancer or a condition that could lead to cancer, you need Pap tests and screening for cancer for at least 20 years after your treatment. If Pap tests have been discontinued, your risk factors (such as having a new sexual partner) need to be reassessed to determine if screening should resume. Some women have medical problems that increase the chance of getting cervical cancer. In these cases, your health care provider may recommend more frequent screening and Pap tests.  Colorectal cancer can be detected and often prevented. Most routine colorectal cancer screening begins at the age of 50 years and continues through age 75 years. However, your health care provider may recommend screening at an earlier age if you have risk factors for colon cancer. On a yearly basis, your health care provider may provide home test kits to check  for hidden blood in the stool. Use of a small camera at the end of a tube, to directly examine the colon (sigmoidoscopy or colonoscopy), can detect the earliest forms of colorectal cancer. Talk to your health care provider about this at age 50, when routine screening begins. Direct exam of the colon should be repeated every 5-10 years through age 75 years, unless early forms of precancerous polyps or small growths are found.  People who are at an increased risk for hepatitis B should be screened for this virus. You are considered at high risk for hepatitis B if:  You were born in a country where hepatitis B occurs often. Talk with your health care provider about which countries are considered high risk.  Your parents were born in a high-risk country and you have not received a shot to protect against hepatitis B (hepatitis B vaccine).  You have HIV or AIDS.  You use needles to inject street drugs.  You live with, or have sex with, someone who has hepatitis B.  You get hemodialysis treatment.  You take certain medicines for conditions like cancer, organ transplantation, and autoimmune conditions.  Hepatitis C blood testing is recommended for all people born from 1945 through 1965 and any individual with known risks for hepatitis C.  Practice safe sex. Use condoms and avoid high-risk sexual practices to reduce the spread of sexually transmitted infections (STIs). STIs include gonorrhea, chlamydia, syphilis, trichomonas, herpes, HPV, and human immunodeficiency virus (HIV). Herpes, HIV, and HPV are viral illnesses that have no cure. They can result in disability, cancer, and death.  You should be screened for sexually transmitted illnesses (STIs) including gonorrhea and chlamydia if:  You are sexually active and are younger than 24 years.  You are older than 24 years and your health care provider tells you that you are at risk for this type of infection.  Your sexual activity has changed  since you were last screened and you are at an increased risk for chlamydia or gonorrhea. Ask your health care provider if you are at risk.  If you are at risk of being infected with HIV, it is recommended that you take a prescription medicine daily to prevent HIV infection. This is called preexposure prophylaxis (PrEP). You are considered at risk if:  You are sexually active and do not regularly use condoms or know the HIV status of your partner(s).  You take drugs by injection.  You are sexually active with a partner who has HIV.  Talk with your health care provider about whether you are at high risk of being infected with HIV. If   you choose to begin PrEP, you should first be tested for HIV. You should then be tested every 3 months for as long as you are taking PrEP.  Osteoporosis is a disease in which the bones lose minerals and strength with aging. This can result in serious bone fractures or breaks. The risk of osteoporosis can be identified using a bone density scan. Women ages 65 years and over and women at risk for fractures or osteoporosis should discuss screening with their health care providers. Ask your health care provider whether you should take a calcium supplement or vitamin D to reduce the rate of osteoporosis.  Menopause can be associated with physical symptoms and risks. Hormone replacement therapy is available to decrease symptoms and risks. You should talk to your health care provider about whether hormone replacement therapy is right for you.  Use sunscreen. Apply sunscreen liberally and repeatedly throughout the day. You should seek shade when your shadow is shorter than you. Protect yourself by wearing long sleeves, pants, a wide-brimmed hat, and sunglasses year round, whenever you are outdoors.  Once a month, do a whole body skin exam, using a mirror to look at the skin on your back. Tell your health care provider of new moles, moles that have irregular borders, moles that  are larger than a pencil eraser, or moles that have changed in shape or color.  Stay current with required vaccines (immunizations).  Influenza vaccine. All adults should be immunized every year.  Tetanus, diphtheria, and acellular pertussis (Td, Tdap) vaccine. Pregnant women should receive 1 dose of Tdap vaccine during each pregnancy. The dose should be obtained regardless of the length of time since the last dose. Immunization is preferred during the 27th-36th week of gestation. An adult who has not previously received Tdap or who does not know her vaccine status should receive 1 dose of Tdap. This initial dose should be followed by tetanus and diphtheria toxoids (Td) booster doses every 10 years. Adults with an unknown or incomplete history of completing a 3-dose immunization series with Td-containing vaccines should begin or complete a primary immunization series including a Tdap dose. Adults should receive a Td booster every 10 years.  Varicella vaccine. An adult without evidence of immunity to varicella should receive 2 doses or a second dose if she has previously received 1 dose. Pregnant females who do not have evidence of immunity should receive the first dose after pregnancy. This first dose should be obtained before leaving the health care facility. The second dose should be obtained 4-8 weeks after the first dose.  Human papillomavirus (HPV) vaccine. Females aged 13-26 years who have not received the vaccine previously should obtain the 3-dose series. The vaccine is not recommended for use in pregnant females. However, pregnancy testing is not needed before receiving a dose. If a female is found to be pregnant after receiving a dose, no treatment is needed. In that case, the remaining doses should be delayed until after the pregnancy. Immunization is recommended for any person with an immunocompromised condition through the age of 26 years if she did not get any or all doses earlier. During the  3-dose series, the second dose should be obtained 4-8 weeks after the first dose. The third dose should be obtained 24 weeks after the first dose and 16 weeks after the second dose.  Zoster vaccine. One dose is recommended for adults aged 60 years or older unless certain conditions are present.  Measles, mumps, and rubella (MMR) vaccine. Adults born   before 1957 generally are considered immune to measles and mumps. Adults born in 1957 or later should have 1 or more doses of MMR vaccine unless there is a contraindication to the vaccine or there is laboratory evidence of immunity to each of the three diseases. A routine second dose of MMR vaccine should be obtained at least 28 days after the first dose for students attending postsecondary schools, health care workers, or international travelers. People who received inactivated measles vaccine or an unknown type of measles vaccine during 1963-1967 should receive 2 doses of MMR vaccine. People who received inactivated mumps vaccine or an unknown type of mumps vaccine before 1979 and are at high risk for mumps infection should consider immunization with 2 doses of MMR vaccine. For females of childbearing age, rubella immunity should be determined. If there is no evidence of immunity, females who are not pregnant should be vaccinated. If there is no evidence of immunity, females who are pregnant should delay immunization until after pregnancy. Unvaccinated health care workers born before 1957 who lack laboratory evidence of measles, mumps, or rubella immunity or laboratory confirmation of disease should consider measles and mumps immunization with 2 doses of MMR vaccine or rubella immunization with 1 dose of MMR vaccine.  Pneumococcal 13-valent conjugate (PCV13) vaccine. When indicated, a person who is uncertain of his immunization history and has no record of immunization should receive the PCV13 vaccine. All adults 65 years of age and older should receive this  vaccine. An adult aged 19 years or older who has certain medical conditions and has not been previously immunized should receive 1 dose of PCV13 vaccine. This PCV13 should be followed with a dose of pneumococcal polysaccharide (PPSV23) vaccine. Adults who are at high risk for pneumococcal disease should obtain the PPSV23 vaccine at least 8 weeks after the dose of PCV13 vaccine. Adults older than 52 years of age who have normal immune system function should obtain the PPSV23 vaccine dose at least 1 year after the dose of PCV13 vaccine.  Pneumococcal polysaccharide (PPSV23) vaccine. When PCV13 is also indicated, PCV13 should be obtained first. All adults aged 65 years and older should be immunized. An adult younger than age 65 years who has certain medical conditions should be immunized. Any person who resides in a nursing home or long-term care facility should be immunized. An adult smoker should be immunized. People with an immunocompromised condition and certain other conditions should receive both PCV13 and PPSV23 vaccines. People with human immunodeficiency virus (HIV) infection should be immunized as soon as possible after diagnosis. Immunization during chemotherapy or radiation therapy should be avoided. Routine use of PPSV23 vaccine is not recommended for American Indians, [place] Natives, or people younger than 65 years unless there are medical conditions that require PPSV23 vaccine. When indicated, people who have unknown immunization and have no record of immunization should receive PPSV23 vaccine. One-time revaccination 5 years after the first dose of PPSV23 is recommended for people aged 19-64 years who have chronic kidney failure, nephrotic syndrome, asplenia, or immunocompromised conditions. People who received 1-2 doses of PPSV23 before age 65 years should receive another dose of PPSV23 vaccine at age 65 years or later if at least 5 years have passed since the previous dose. Doses of PPSV23 are not  needed for people immunized with PPSV23 at or after age 65 years.  Meningococcal vaccine. Adults with asplenia or persistent complement component deficiencies should receive 2 doses of quadrivalent meningococcal conjugate (MenACWY-D) vaccine. The doses should be obtained   at least 2 months apart. Microbiologists working with certain meningococcal bacteria, military recruits, people at risk during an outbreak, and people who travel to or live in countries with a high rate of meningitis should be immunized. A first-year college student up through age 21 years who is living in a residence hall should receive a dose if she did not receive a dose on or after her 16th birthday. Adults who have certain high-risk conditions should receive one or more doses of vaccine.  Hepatitis A vaccine. Adults who wish to be protected from this disease, have certain high-risk conditions, work with hepatitis A-infected animals, work in hepatitis A research labs, or travel to or work in countries with a high rate of hepatitis A should be immunized. Adults who were previously unvaccinated and who anticipate close contact with an international adoptee during the first 60 days after arrival in the [place] from a country with a high rate of hepatitis A should be immunized.  Hepatitis B vaccine. Adults who wish to be protected from this disease, have certain high-risk conditions, may be exposed to blood or other infectious body fluids, are household contacts or sex partners of hepatitis B positive people, are clients or workers in certain care facilities, or travel to or work in countries with a high rate of hepatitis B should be immunized.  Haemophilus influenzae type b (Hib) vaccine. A previously unvaccinated person with asplenia or sickle cell disease or having a scheduled splenectomy should receive 1 dose of Hib vaccine. Regardless of previous immunization, a recipient of a hematopoietic stem cell transplant should receive a  3-dose series 6-12 months after her successful transplant. Hib vaccine is not recommended for adults with HIV infection. Preventive Services / Frequency Ages 19 to 39 years  Blood pressure check.** / Every 3-5 years.  Lipid and cholesterol check.** / Every 5 years beginning at age 20.  Clinical breast exam.** / Every 3 years for women in their 20s and 30s.  BRCA-related cancer risk assessment.** / For women who have family members with a BRCA-related cancer (breast, ovarian, tubal, or peritoneal cancers).  Pap test.** / Every 2 years from ages 21 through 29. Every 3 years starting at age 30 through age 65 or 70 with a history of 3 consecutive normal Pap tests.  HPV screening.** / Every 3 years from ages 30 through ages 65 to 70 with a history of 3 consecutive normal Pap tests.  Hepatitis C blood test.** / For any individual with known risks for hepatitis C.  Skin self-exam. / Monthly.  Influenza vaccine. / Every year.  Tetanus, diphtheria, and acellular pertussis (Tdap, Td) vaccine.** / Consult your health care provider. Pregnant women should receive 1 dose of Tdap vaccine during each pregnancy. 1 dose of Td every 10 years.  Varicella vaccine.** / Consult your health care provider. Pregnant females who do not have evidence of immunity should receive the first dose after pregnancy.  HPV vaccine. / 3 doses over 6 months, if 26 and younger. The vaccine is not recommended for use in pregnant females. However, pregnancy testing is not needed before receiving a dose.  Measles, mumps, rubella (MMR) vaccine.** / You need at least 1 dose of MMR if you were born in 1957 or later. You may also need a 2nd dose. For females of childbearing age, rubella immunity should be determined. If there is no evidence of immunity, females who are not pregnant should be vaccinated. If there is no evidence of immunity, females who are   pregnant should delay immunization until after pregnancy.  Pneumococcal  13-valent conjugate (PCV13) vaccine.** / Consult your health care provider.  Pneumococcal polysaccharide (PPSV23) vaccine.** / 1 to 2 doses if you smoke cigarettes or if you have certain conditions.  Meningococcal vaccine.** / 1 dose if you are age 19 to 21 years and a first-year college student living in a residence hall, or have one of several medical conditions, you need to get vaccinated against meningococcal disease. You may also need additional booster doses.  Hepatitis A vaccine.** / Consult your health care provider.  Hepatitis B vaccine.** / Consult your health care provider.  Haemophilus influenzae type b (Hib) vaccine.** / Consult your health care provider. Ages 40 to 64 years  Blood pressure check.** / Every year.  Lipid and cholesterol check.** / Every 5 years beginning at age 20 years.  Lung cancer screening. / Every year if you are aged 55-80 years and have a 30-pack-year history of smoking and currently smoke or have quit within the past 15 years. Yearly screening is stopped once you have quit smoking for at least 15 years or develop a health problem that would prevent you from having lung cancer treatment.  Clinical breast exam.** / Every year after age 40 years.  BRCA-related cancer risk assessment.** / For women who have family members with a BRCA-related cancer (breast, ovarian, tubal, or peritoneal cancers).  Mammogram.** / Every year beginning at age 40 years and continuing for as long as you are in good health. Consult with your health care provider.  Pap test.** / Every 3 years starting at age 30 years through age 65 or 70 years with a history of 3 consecutive normal Pap tests.  HPV screening.** / Every 3 years from ages 30 years through ages 65 to 70 years with a history of 3 consecutive normal Pap tests.  Fecal occult blood test (FOBT) of stool. / Every year beginning at age 50 years and continuing until age 75 years. You may not need to do this test if you get  a colonoscopy every 10 years.  Flexible sigmoidoscopy or colonoscopy.** / Every 5 years for a flexible sigmoidoscopy or every 10 years for a colonoscopy beginning at age 50 years and continuing until age 75 years.  Hepatitis C blood test.** / For all people born from 1945 through 1965 and any individual with known risks for hepatitis C.  Skin self-exam. / Monthly.  Influenza vaccine. / Every year.  Tetanus, diphtheria, and acellular pertussis (Tdap/Td) vaccine.** / Consult your health care provider. Pregnant women should receive 1 dose of Tdap vaccine during each pregnancy. 1 dose of Td every 10 years.  Varicella vaccine.** / Consult your health care provider. Pregnant females who do not have evidence of immunity should receive the first dose after pregnancy.  Zoster vaccine.** / 1 dose for adults aged 60 years or older.  Measles, mumps, rubella (MMR) vaccine.** / You need at least 1 dose of MMR if you were born in 1957 or later. You may also need a second dose. For females of childbearing age, rubella immunity should be determined. If there is no evidence of immunity, females who are not pregnant should be vaccinated. If there is no evidence of immunity, females who are pregnant should delay immunization until after pregnancy.  Pneumococcal 13-valent conjugate (PCV13) vaccine.** / Consult your health care provider.  Pneumococcal polysaccharide (PPSV23) vaccine.** / 1 to 2 doses if you smoke cigarettes or if you have certain conditions.  Meningococcal vaccine.** /   Consult your health care provider.  Hepatitis A vaccine.** / Consult your health care provider.  Hepatitis B vaccine.** / Consult your health care provider.  Haemophilus influenzae type b (Hib) vaccine.** / Consult your health care provider. Ages 65 years and over  Blood pressure check.** / Every year.  Lipid and cholesterol check.** / Every 5 years beginning at age 20 years.  Lung cancer screening. / Every year if you  are aged 55-80 years and have a 30-pack-year history of smoking and currently smoke or have quit within the past 15 years. Yearly screening is stopped once you have quit smoking for at least 15 years or develop a health problem that would prevent you from having lung cancer treatment.  Clinical breast exam.** / Every year after age 40 years.  BRCA-related cancer risk assessment.** / For women who have family members with a BRCA-related cancer (breast, ovarian, tubal, or peritoneal cancers).  Mammogram.** / Every year beginning at age 40 years and continuing for as long as you are in good health. Consult with your health care provider.  Pap test.** / Every 3 years starting at age 30 years through age 65 or 70 years with 3 consecutive normal Pap tests. Testing can be stopped between 65 and 70 years with 3 consecutive normal Pap tests and no abnormal Pap or HPV tests in the past 10 years.  HPV screening.** / Every 3 years from ages 30 years through ages 65 or 70 years with a history of 3 consecutive normal Pap tests. Testing can be stopped between 65 and 70 years with 3 consecutive normal Pap tests and no abnormal Pap or HPV tests in the past 10 years.  Fecal occult blood test (FOBT) of stool. / Every year beginning at age 50 years and continuing until age 75 years. You may not need to do this test if you get a colonoscopy every 10 years.  Flexible sigmoidoscopy or colonoscopy.** / Every 5 years for a flexible sigmoidoscopy or every 10 years for a colonoscopy beginning at age 50 years and continuing until age 75 years.  Hepatitis C blood test.** / For all people born from 1945 through 1965 and any individual with known risks for hepatitis C.  Osteoporosis screening.** / A one-time screening for women ages 65 years and over and women at risk for fractures or osteoporosis.  Skin self-exam. / Monthly.  Influenza vaccine. / Every year.  Tetanus, diphtheria, and acellular pertussis (Tdap/Td)  vaccine.** / 1 dose of Td every 10 years.  Varicella vaccine.** / Consult your health care provider.  Zoster vaccine.** / 1 dose for adults aged 60 years or older.  Pneumococcal 13-valent conjugate (PCV13) vaccine.** / Consult your health care provider.  Pneumococcal polysaccharide (PPSV23) vaccine.** / 1 dose for all adults aged 65 years and older.  Meningococcal vaccine.** / Consult your health care provider.  Hepatitis A vaccine.** / Consult your health care provider.  Hepatitis B vaccine.** / Consult your health care provider.  Haemophilus influenzae type b (Hib) vaccine.** / Consult your health care provider. ** Family history and personal history of risk and conditions may change your health care provider's recommendations.   This information is not intended to replace advice given to you by your health care provider. Make sure you discuss any questions you have with your health care provider.   Document Released: 12/11/2001 Document Revised: 11/05/2014 Document Reviewed: 03/12/2011 Elsevier Interactive Patient Education 2016 Elsevier Inc.  

## 2015-12-31 LAB — HEPATITIS C ANTIBODY: HCV Ab: NEGATIVE

## 2016-01-03 LAB — CYTOLOGY - PAP

## 2016-01-06 ENCOUNTER — Encounter: Payer: Self-pay | Admitting: Internal Medicine

## 2016-01-06 ENCOUNTER — Other Ambulatory Visit: Payer: Self-pay

## 2016-01-06 ENCOUNTER — Encounter: Payer: Self-pay | Admitting: Family Medicine

## 2016-01-06 ENCOUNTER — Other Ambulatory Visit: Payer: Self-pay | Admitting: Family Medicine

## 2016-01-06 DIAGNOSIS — R87610 Atypical squamous cells of undetermined significance on cytologic smear of cervix (ASC-US): Secondary | ICD-10-CM

## 2016-01-06 DIAGNOSIS — R87618 Other abnormal cytological findings on specimens from cervix uteri: Secondary | ICD-10-CM

## 2016-01-26 ENCOUNTER — Encounter: Payer: Self-pay | Admitting: Obstetrics & Gynecology

## 2016-01-26 ENCOUNTER — Other Ambulatory Visit (HOSPITAL_COMMUNITY)
Admission: RE | Admit: 2016-01-26 | Discharge: 2016-01-26 | Disposition: A | Discharge status: Home / Self Care | Payer: Federal, State, Local not specified - PPO | Source: Ambulatory Visit | Attending: Obstetrics & Gynecology | Admitting: Obstetrics & Gynecology

## 2016-01-26 ENCOUNTER — Ambulatory Visit (INDEPENDENT_AMBULATORY_CARE_PROVIDER_SITE_OTHER): Payer: Federal, State, Local not specified - PPO | Admitting: Obstetrics & Gynecology

## 2016-01-26 VITALS — BP 133/81 | HR 68 | Ht 64.0 in | Wt 202.0 lb

## 2016-01-26 DIAGNOSIS — R87610 Atypical squamous cells of undetermined significance on cytologic smear of cervix (ASC-US): Secondary | ICD-10-CM | POA: Insufficient documentation

## 2016-01-26 NOTE — Progress Notes (Signed)
   Subjective:    Patient ID: Victoria Caldwell, female    DOB: [birth year], 50 y.o.   MRN: [id number]  HPI 50 yo lady here for a colpo due to pap 3/17 that showed ASCUS, negative HR HPV. She had a [place] in the past. She is menopausal.   Review of Systems     Objective:   Physical Exam [id number] Breathing, conversing, and ambulating normally UPT negative, consent signed, time out done Cervix prepped with acetic acid. Transformation zone seen in its entirety. Colpo adequate. Normal atrophic cervix noted ECC obtained. She tolerated the procedure well.      Assessment & Plan:  ASCUS pap- If ECC negative, then rec pap with cotesting in a year

## 2016-01-26 NOTE — Addendum Note (Signed)
Addended by: Phill Myron on: 01/26/2016 10:39 AM   Modules accepted: Orders

## 2016-02-13 ENCOUNTER — Ambulatory Visit (AMBULATORY_SURGERY_CENTER): Payer: Self-pay | Admitting: *Deleted

## 2016-02-13 VITALS — Ht 64.0 in | Wt 201.0 lb

## 2016-02-13 DIAGNOSIS — Z1211 Encounter for screening for malignant neoplasm of colon: Secondary | ICD-10-CM

## 2016-02-13 MED ORDER — NA SULFATE-K SULFATE-MG SULF 17.5-3.13-1.6 GM/177ML PO SOLN: ORAL | Status: DC

## 2016-02-13 NOTE — Progress Notes (Signed)
Patient denies any allergies to eggs or soy. Patient denies any problems with anesthesia/sedation. Patient denies any oxygen use at home and does not take any diet/weight loss medications. Patient declined EMMI education. 

## 2016-02-27 ENCOUNTER — Ambulatory Visit (AMBULATORY_SURGERY_CENTER): Payer: Federal, State, Local not specified - PPO | Admitting: Internal Medicine

## 2016-02-27 ENCOUNTER — Encounter: Payer: Self-pay | Admitting: Internal Medicine

## 2016-02-27 VITALS — BP 122/66 | HR 66 | Temp 96.2°F | Resp 18 | Ht 64.0 in | Wt 201.0 lb

## 2016-02-27 DIAGNOSIS — Z1211 Encounter for screening for malignant neoplasm of colon: Secondary | ICD-10-CM

## 2016-02-27 MED ORDER — SODIUM CHLORIDE 0.9 % IV SOLN: 500.0000 mL | INTRAVENOUS | Status: DC

## 2016-02-27 NOTE — Op Note (Signed)
[place] Patient Name: Victoria Caldwell Procedure Date: 02/27/2016 11:23 AM MRN: [id number] Endoscopist: Jerene Bears , MD Age: 50 Date of Birth: 1966-02-24 Gender: Female Procedure:                Colonoscopy Indications:              Screening for colorectal malignant neoplasm, This                            is the patient's first colonoscopy Medicines:                Monitored Anesthesia Care Procedure:                Pre-Anesthesia Assessment:                           - Prior to the procedure, a History and Physical                            was performed, and patient medications and                            allergies were reviewed. The patient's tolerance of                            previous anesthesia was also reviewed. The risks                            and benefits of the procedure and the sedation                            options and risks were discussed with the patient.                            All questions were answered, and informed consent                            was obtained. Prior Anticoagulants: The patient has                            taken no previous anticoagulant or antiplatelet                            agents. ASA Grade Assessment: I - A normal, healthy                            patient. After reviewing the risks and benefits,                            the patient was deemed in satisfactory condition to                            undergo the procedure.  After obtaining informed consent, the colonoscope                            was passed under direct vision. Throughout the                            procedure, the patient's blood pressure, pulse, and                            oxygen saturations were monitored continuously. The                            Model PCF-H190L [id number]) scope was introduced                            through the anus and advanced to the the cecum,   identified by appendiceal orifice and ileocecal                            valve. The colonoscopy was performed without                            difficulty. The patient tolerated the procedure                            well. The quality of the bowel preparation was                            excellent. The ileocecal valve, appendiceal                            orifice, and rectum were photographed. Scope In: 11:36:35 AM Scope Out: 11:49:32 AM Scope Withdrawal Time: 0 hours 8 minutes 17 seconds  Total Procedure Duration: 0 hours 12 minutes 57 seconds  Findings:                 The digital rectal exam was normal.                           The entire examined colon appeared normal.                           Internal hemorrhoids were found during                            retroflexion. The hemorrhoids were small. Complications:            No immediate complications. Estimated Blood Loss:     Estimated blood loss: none. Impression:               - The entire examined colon is normal.                           - Internal hemorrhoids.                           - No  specimens collected. Recommendation:           - Patient has a contact number available for                            emergencies. The signs and symptoms of potential                            delayed complications were discussed with the                            patient. Return to normal activities tomorrow.                            Written discharge instructions were provided to the                            patient.                           - Resume previous diet.                           - Continue present medications.                           - Repeat colonoscopy in 10 years for screening                            purposes. Jerene Bears, MD 02/27/2016 11:52:44 AM This report has been signed electronically.

## 2016-02-27 NOTE — Progress Notes (Signed)
A/ox3, pleased with MAC, report to RN 

## 2016-02-27 NOTE — Patient Instructions (Signed)
YOU HAD AN ENDOSCOPIC PROCEDURE TODAY AT THE [facility]:   Refer to the procedure report that was given to you for any specific questions about what was found during the examination.  If the procedure report does not answer your questions, please call your gastroenterologist to clarify.  If you requested that your care partner not be given the details of your procedure findings, then the procedure report has been included in a sealed envelope for you to review at your convenience later.  YOU SHOULD EXPECT: Some feelings of bloating in the abdomen. Passage of more gas than usual.  Walking can help get rid of the air that was put into your GI tract during the procedure and reduce the bloating. If you had a lower endoscopy (such as a colonoscopy or flexible sigmoidoscopy) you may notice spotting of blood in your stool or on the toilet paper. If you underwent a bowel prep for your procedure, you may not have a normal bowel movement for a few days.  Please Note:  You might notice some irritation and congestion in your nose or some drainage.  This is from the oxygen used during your procedure.  There is no need for concern and it should clear up in a day or so.  SYMPTOMS TO REPORT IMMEDIATELY:   Following lower endoscopy (colonoscopy or flexible sigmoidoscopy):  Excessive amounts of blood in the stool  Significant tenderness or worsening of abdominal pains  Swelling of the abdomen that is new, acute  Fever of 100F or higher   For urgent or emergent issues, a gastroenterologist can be reached at any hour by calling [phone].   DIET: Your first meal following the procedure should be a small meal and then it is ok to progress to your normal diet. Heavy or fried foods are harder to digest and may make you feel nauseous or bloated.  Likewise, meals heavy in dairy and vegetables can increase bloating.  Drink plenty of fluids but you should avoid alcoholic beverages for 24  hours.  ACTIVITY:  You should plan to take it easy for the rest of today and you should NOT DRIVE or use heavy machinery until tomorrow (because of the sedation medicines used during the test).    FOLLOW UP: Our staff will call the number listed on your records the next business day following your procedure to check on you and address any questions or concerns that you may have regarding the information given to you following your procedure. If we do not reach you, we will leave a message.  However, if you are feeling well and you are not experiencing any problems, there is no need to return our call.  We will assume that you have returned to your regular daily activities without incident.  If any biopsies were taken you will be contacted by phone or by letter within the next 1-3 weeks.  Please call us at [phone] if you have not heard about the biopsies in 3 weeks.    SIGNATURES/CONFIDENTIALITY: You and/or your care partner have signed paperwork which will be entered into your electronic medical record.  These signatures attest to the fact that that the information above on your After Visit Summary has been reviewed and is understood.  Full responsibility of the confidentiality of this discharge information lies with you and/or your care-partner.  Normal exam  Repeat colonoscopy in 10 years-2027. 

## 2016-02-28 ENCOUNTER — Telehealth: Payer: Self-pay

## 2016-02-28 NOTE — Telephone Encounter (Signed)
  Follow up Call-  Call back number 02/27/2016  Post procedure Call Back phone  # [phone]  Permission to leave phone message Yes    Patient was called for follow up after her procedure on 02/27/2016. No answer at the number given for follow up phone call. A message was left on the answering machine.

## 2016-07-26 DIAGNOSIS — K8012 Calculus of gallbladder with acute and chronic cholecystitis without obstruction: Secondary | ICD-10-CM | POA: Diagnosis not present

## 2016-08-16 DIAGNOSIS — R05 Cough: Secondary | ICD-10-CM | POA: Diagnosis not present

## 2016-08-16 DIAGNOSIS — J029 Acute pharyngitis, unspecified: Secondary | ICD-10-CM | POA: Diagnosis not present

## 2017-02-12 DIAGNOSIS — Z1231 Encounter for screening mammogram for malignant neoplasm of breast: Secondary | ICD-10-CM | POA: Diagnosis not present

## 2017-02-12 LAB — HM MAMMOGRAPHY

## 2017-02-15 ENCOUNTER — Encounter: Payer: Self-pay | Admitting: *Deleted

## 2017-05-14 ENCOUNTER — Encounter: Payer: Self-pay | Admitting: Family Medicine

## 2017-05-14 ENCOUNTER — Ambulatory Visit (INDEPENDENT_AMBULATORY_CARE_PROVIDER_SITE_OTHER): Payer: Federal, State, Local not specified - PPO | Admitting: Family Medicine

## 2017-05-14 VITALS — BP 120/76 | HR 78 | Temp 98.1°F | Resp 16 | Ht 64.0 in | Wt 206.0 lb

## 2017-05-14 DIAGNOSIS — R0683 Snoring: Secondary | ICD-10-CM | POA: Diagnosis not present

## 2017-05-14 DIAGNOSIS — R8299 Other abnormal findings in urine: Secondary | ICD-10-CM

## 2017-05-14 DIAGNOSIS — R82998 Other abnormal findings in urine: Secondary | ICD-10-CM

## 2017-05-14 DIAGNOSIS — Z0000 Encounter for general adult medical examination without abnormal findings: Secondary | ICD-10-CM | POA: Diagnosis not present

## 2017-05-14 DIAGNOSIS — R1013 Epigastric pain: Secondary | ICD-10-CM | POA: Diagnosis not present

## 2017-05-14 LAB — POC URINALSYSI DIPSTICK (AUTOMATED)
BILIRUBIN UA: NEGATIVE
Blood, UA: NEGATIVE
GLUCOSE UA: NEGATIVE
KETONES UA: NEGATIVE
Nitrite, UA: NEGATIVE
PROTEIN UA: NEGATIVE
SPEC GRAV UA: 1.015 (ref 1.010–1.025)
Urobilinogen, UA: 0.2 E.U./dL
pH, UA: 6 (ref 5.0–8.0)

## 2017-05-14 LAB — COMPREHENSIVE METABOLIC PANEL
ALT: 13 U/L (ref 0–35)
AST: 16 U/L (ref 0–37)
Albumin: 4.3 g/dL (ref 3.5–5.2)
Alkaline Phosphatase: 54 U/L (ref 39–117)
BUN: 11 mg/dL (ref 6–23)
CHLORIDE: 105 meq/L (ref 96–112)
CO2: 25 mEq/L (ref 19–32)
Calcium: 9.4 mg/dL (ref 8.4–10.5)
Creatinine, Ser: 0.78 mg/dL (ref 0.40–1.20)
GFR: 82.13 mL/min (ref >60.00)
GLUCOSE: 120 mg/dL — AB (ref 70–99)
Potassium: 3.9 mEq/L (ref 3.5–5.1)
SODIUM: 138 meq/L (ref 135–145)
Total Bilirubin: 0.3 mg/dL (ref 0.2–1.2)
Total Protein: 7.3 g/dL (ref 6.0–8.3)

## 2017-05-14 LAB — LIPID PANEL
Cholesterol: 163 mg/dL (ref 0–200)
HDL: 63.1 mg/dL (ref >39.00)
LDL CALC: 85 mg/dL (ref 0–99)
NONHDL: 100.27
Total CHOL/HDL Ratio: 3
Triglycerides: 76 mg/dL (ref 0.0–149.0)
VLDL: 15.2 mg/dL (ref 0.0–40.0)

## 2017-05-14 LAB — CBC WITH DIFFERENTIAL/PLATELET
BASOS PCT: 0.8 % (ref 0.0–3.0)
Basophils Absolute: 0 10*3/uL (ref 0.0–0.1)
EOS PCT: 1.9 % (ref 0.0–5.0)
Eosinophils Absolute: 0.1 10*3/uL (ref 0.0–0.7)
HCT: 38.1 % (ref 36.0–46.0)
Hemoglobin: 12.4 g/dL (ref 12.0–15.0)
LYMPHS ABS: 2.7 10*3/uL (ref 0.7–4.0)
Lymphocytes Relative: 43.7 % (ref 12.0–46.0)
MCHC: 32.5 g/dL (ref 30.0–36.0)
MCV: 84.6 fl (ref 78.0–100.0)
MONO ABS: 0.4 10*3/uL (ref 0.1–1.0)
Monocytes Relative: 6.4 % (ref 3.0–12.0)
NEUTROS ABS: 2.9 10*3/uL (ref 1.4–7.7)
NEUTROS PCT: 47.2 % (ref 43.0–77.0)
PLATELETS: 220 10*3/uL (ref 150.0–400.0)
RBC: 4.5 Mil/uL (ref 3.87–5.11)
RDW: 15.1 % (ref 11.5–15.5)
WBC: 6.1 10*3/uL (ref 4.0–10.5)

## 2017-05-14 LAB — TSH: TSH: 2.94 u[IU]/mL (ref 0.35–4.50)

## 2017-05-14 MED ORDER — RANITIDINE HCL 150 MG PO TABS: 150.0000 mg | ORAL_TABLET | Freq: Two times a day (BID) | ORAL | 5 refills | Status: DC

## 2017-05-14 NOTE — Patient Instructions (Addendum)
Preventive Care 40-64 Years, Female Preventive care refers to lifestyle choices and visits with your health care provider that can promote health and wellness. What does preventive care include?  A yearly physical exam. This is also called an annual well check.  Dental exams once or twice a year.  Routine eye exams. Ask your health care provider how often you should have your eyes checked.  Personal lifestyle choices, including: ? Daily care of your teeth and gums. ? Regular physical activity. ? Eating a healthy diet. ? Avoiding tobacco and drug use. ? Limiting alcohol use. ? Practicing safe sex. ? Taking low-dose aspirin daily starting at age 58. ? Taking vitamin and mineral supplements as recommended by your health care provider. What happens during an annual well check? The services and screenings done by your health care provider during your annual well check will depend on your age, overall health, lifestyle risk factors, and family history of disease. Counseling Your health care provider may ask you questions about your:  Alcohol use.  Tobacco use.  Drug use.  Emotional well-being.  Home and relationship well-being.  Sexual activity.  Eating habits.  Work and work Statistician.  Method of birth control.  Menstrual cycle.  Pregnancy history.  Screening You may have the following tests or measurements:  Height, weight, and BMI.  Blood pressure.  Lipid and cholesterol levels. These may be checked every 5 years, or more frequently if you are over 81 years old.  Skin check.  Lung cancer screening. You may have this screening every year starting at age 78 if you have a 30-pack-year history of smoking and currently smoke or have quit within the past 15 years.  Fecal occult blood test (FOBT) of the stool. You may have this test every year starting at age 65.  Flexible sigmoidoscopy or colonoscopy. You may have a sigmoidoscopy every 5 years or a colonoscopy  every 10 years starting at age 30.  Hepatitis C blood test.  Hepatitis B blood test.  Sexually transmitted disease (STD) testing.  Diabetes screening. This is done by checking your blood sugar (glucose) after you have not eaten for a while (fasting). You may have this done every 1-3 years.  Mammogram. This may be done every 1-2 years. Talk to your health care provider about when you should start having regular mammograms. This may depend on whether you have a family history of breast cancer.  BRCA-related cancer screening. This may be done if you have a family history of breast, ovarian, tubal, or peritoneal cancers.  Pelvic exam and Pap test. This may be done every 3 years starting at age 80. Starting at age 36, this may be done every 5 years if you have a Pap test in combination with an HPV test.  Bone density scan. This is done to screen for osteoporosis. You may have this scan if you are at high risk for osteoporosis.  Discuss your test results, treatment options, and if necessary, the need for more tests with your health care provider. Vaccines Your health care provider may recommend certain vaccines, such as:  Influenza vaccine. This is recommended every year.  Tetanus, diphtheria, and acellular pertussis (Tdap, Td) vaccine. You may need a Td booster every 10 years.  Varicella vaccine. You may need this if you have not been vaccinated.  Zoster vaccine. You may need this after age 5.  Measles, mumps, and rubella (MMR) vaccine. You may need at least one dose of MMR if you were born in  1957 or later. You may also need a second dose.  Pneumococcal 13-valent conjugate (PCV13) vaccine. You may need this if you have certain conditions and were not previously vaccinated.  Pneumococcal polysaccharide (PPSV23) vaccine. You may need one or two doses if you smoke cigarettes or if you have certain conditions.  Meningococcal vaccine. You may need this if you have certain  conditions.  Hepatitis A vaccine. You may need this if you have certain conditions or if you travel or work in places where you may be exposed to hepatitis A.  Hepatitis B vaccine. You may need this if you have certain conditions or if you travel or work in places where you may be exposed to hepatitis B.  Haemophilus influenzae type b (Hib) vaccine. You may need this if you have certain conditions.  Talk to your health care provider about which screenings and vaccines you need and how often you need them. This information is not intended to replace advice given to you by your health care provider. Make sure you discuss any questions you have with your health care provider. Document Released: 11/11/2015 Document Revised: 07/04/2016 Document Reviewed: 08/16/2015 Elsevier Interactive Patient Education  2017 [employer] for Gastroesophageal Reflux Disease, Adult When you have gastroesophageal reflux disease (GERD), the foods you eat and your eating habits are very important. Choosing the right foods can help ease your discomfort. What guidelines do I need to follow?  Choose fruits, vegetables, whole grains, and low-fat dairy products.  Choose low-fat meat, fish, and poultry.  Limit fats such as oils, salad dressings, butter, nuts, and avocado.  Keep a food diary. This helps you identify foods that cause symptoms.  Avoid foods that cause symptoms. These may be different for everyone.  Eat small meals often instead of 3 large meals a day.  Eat your meals slowly, in a place where you are relaxed.  Limit fried foods.  Cook foods using methods other than frying.  Avoid drinking alcohol.  Avoid drinking large amounts of liquids with your meals.  Avoid bending over or lying down until 2-3 hours after eating. What foods are not recommended? These are some foods and drinks that may make your symptoms worse: Vegetables Tomatoes. Tomato juice. Tomato and spaghetti  sauce. Chili peppers. Onion and garlic. Horseradish. Fruits Oranges, grapefruit, and lemon (fruit and juice). Meats High-fat meats, fish, and poultry. This includes hot dogs, ribs, ham, sausage, salami, and bacon. Dairy Whole milk and chocolate milk. Sour cream. Cream. Butter. Ice cream. Cream cheese. Drinks Coffee and tea. Bubbly (carbonated) drinks or energy drinks. Condiments Hot sauce. Barbecue sauce. Sweets/Desserts Chocolate and cocoa. Donuts. Peppermint and spearmint. Fats and Oils High-fat foods. This includes [place] fries and potato chips. Other Vinegar. Strong spices. This includes black pepper, white pepper, red pepper, cayenne, curry powder, cloves, ginger, and chili powder. The items listed above may not be a complete list of foods and drinks to avoid. Contact your dietitian for more information. This information is not intended to replace advice given to you by your health care provider. Make sure you discuss any questions you have with your health care provider. Document Released: 04/15/2012 Document Revised: 03/22/2016 Document Reviewed: 08/19/2013 Elsevier Interactive Patient Education  2017 [employer].

## 2017-05-14 NOTE — Progress Notes (Signed)
Subjective:   I acted as a Education administrator for Dr. [name].  Guerry Bruin, CMA   Victoria Caldwell is a 51 y.o. female and is here for a comprehensive physical exam. The patient reports problems - acid reflux and snoring.  Social History   Social History  . Marital status: Married    Spouse name: N/A  . Number of children: N/A  . Years of education: N/A   Occupational History  . Not on file.   Social History Main Topics  . Smoking status: Never Smoker  . Smokeless tobacco: Never Used  . Alcohol use 0.6 oz/week    1 Glasses of wine per week     Comment: daily  . Drug use: No  . Sexual activity: Not on file   Other Topics Concern  . Not on file   Social History Narrative   Exercise      Health Maintenance  Topic Date Due  . INFLUENZA VACCINE  05/29/2017  . MAMMOGRAM  02/12/2018  . PAP SMEAR  12/30/2018  . TETANUS/TDAP  10/29/2020  . COLONOSCOPY  02/26/2026  . Hepatitis C Screening  Completed  . HIV Screening  Completed    The following portions of the patient's history were reviewed and updated as appropriate:  She  has a past medical history of Chickenpox and No pertinent past medical history. She  does not have any pertinent problems on file. She  has a past surgical history that includes Back surgery; Laparoscopic supracervical hysterectomy (06/26/2011); and Salpingoophorectomy (06/26/2011). Her family history includes AAA (abdominal aortic aneurysm) in her father; Cancer (age of onset: 70) in her mother; Heart attack (age of onset: 54) in her father; Hypertension in her unknown relative; Kidney disease in her father; Ovarian cysts in her unknown relative. She  reports that she has never smoked. She has never used smokeless tobacco. She reports that she drinks about 0.6 oz of alcohol per week . She reports that she does not use drugs. She has a current medication list which includes the following prescription(s): multivitamin adult and ranitidine. Current Outpatient  Prescriptions on File Prior to Visit  Medication Sig Dispense Refill  . Multiple Vitamins-Minerals (MULTIVITAMIN ADULT) CHEW Chew 2 each by mouth daily.     No current facility-administered medications on file prior to visit.    She has No Known Allergies..  Review of Systems Review of Systems  Constitutional: Negative for activity change, appetite change and fatigue.  HENT: Negative for hearing loss, congestion, tinnitus and ear discharge.  dentist q84m Eyes: Negative for visual disturbance (see optho q1y -- vision corrected to 20/20 with glasses).  Respiratory: Negative for cough, chest tightness and shortness of breath.   Cardiovascular: Negative for chest pain, palpitations and leg swelling.  Gastrointestinal: Negative for abdominal pain, diarrhea, constipation and abdominal distention.  Genitourinary: Negative for urgency, frequency, decreased urine volume and difficulty urinating.  Musculoskeletal: Negative for back pain, arthralgias and gait problem.  Skin: Negative for color change, pallor and rash.  Neurological: Negative for dizziness, light-headedness, numbness and headaches.  Hematological: Negative for adenopathy. Does not bruise/bleed easily.  Psychiatric/Behavioral: Negative for suicidal ideas, confusion, sleep disturbance, self-injury, dysphoric mood, decreased concentration and agitation.       Objective:    BP 120/76 (BP Location: Left Arm, Cuff Size: Normal)   Pulse 78   Temp 98.1 F (36.7 C) (Oral)   Resp 16   Ht 5\' 4"  (1.626 m)   Wt 206 lb (93.4 kg)   LMP 05/28/2011  SpO2 98%   BMI 35.36 kg/m  General appearance: alert, cooperative, appears stated age and no distress Head: Normocephalic, without obvious abnormality, atraumatic Eyes: conjunctivae/corneas clear. PERRL, EOM's intact. Fundi benign. Ears: normal TM's and external ear canals both ears Nose: Nares normal. Septum midline. Mucosa normal. No drainage or sinus tenderness. Throat: lips, mucosa,  and tongue normal; teeth and gums normal Neck: no adenopathy, no carotid bruit, no JVD, supple, symmetrical, trachea midline and thyroid not enlarged, symmetric, no tenderness/mass/nodules Back: symmetric, no curvature. ROM normal. No CVA tenderness. Lungs: clear to auscultation bilaterally Heart: regular rate and rhythm, S1, S2 normal, no murmur, click, rub or gallop Abdomen: soft, non-tender; bowel sounds normal; no masses,  no organomegaly Extremities: extremities normal, atraumatic, no cyanosis or edema Pulses: 2+ and symmetric Skin: Skin color, texture, turgor normal. No rashes or lesions Lymph nodes: Cervical, supraclavicular, and axillary nodes normal. Neurologic: Alert and oriented X 3, normal strength and tone. Normal symmetric reflexes. Normal coordination and gait    Assessment:    Healthy female exam.     Plan:  ghm utd Check labs See After Visit Summary for Counseling Recommendations

## 2017-05-16 LAB — URINE CULTURE

## 2017-09-09 DIAGNOSIS — H5201 Hypermetropia, right eye: Secondary | ICD-10-CM | POA: Diagnosis not present

## 2017-09-18 ENCOUNTER — Encounter: Payer: Self-pay | Admitting: Family Medicine

## 2017-09-18 NOTE — Progress Notes (Signed)
[facility]   OD:  No Abnormalities  OS:  No Abnormalities

## 2017-10-23 DIAGNOSIS — J029 Acute pharyngitis, unspecified: Secondary | ICD-10-CM | POA: Diagnosis not present

## 2017-12-09 DIAGNOSIS — J019 Acute sinusitis, unspecified: Secondary | ICD-10-CM | POA: Diagnosis not present

## 2018-07-07 ENCOUNTER — Encounter: Payer: Self-pay | Admitting: Family Medicine

## 2018-07-07 ENCOUNTER — Ambulatory Visit (INDEPENDENT_AMBULATORY_CARE_PROVIDER_SITE_OTHER): Payer: Federal, State, Local not specified - PPO | Admitting: Family Medicine

## 2018-07-07 VITALS — BP 129/72 | HR 68 | Temp 98.3°F | Resp 16 | Ht 64.0 in | Wt 208.2 lb

## 2018-07-07 DIAGNOSIS — Z0000 Encounter for general adult medical examination without abnormal findings: Secondary | ICD-10-CM | POA: Diagnosis not present

## 2018-07-07 DIAGNOSIS — Z23 Encounter for immunization: Secondary | ICD-10-CM | POA: Diagnosis not present

## 2018-07-07 DIAGNOSIS — Z1231 Encounter for screening mammogram for malignant neoplasm of breast: Secondary | ICD-10-CM | POA: Diagnosis not present

## 2018-07-07 DIAGNOSIS — R0683 Snoring: Secondary | ICD-10-CM

## 2018-07-07 DIAGNOSIS — Z1239 Encounter for other screening for malignant neoplasm of breast: Secondary | ICD-10-CM

## 2018-07-07 DIAGNOSIS — K219 Gastro-esophageal reflux disease without esophagitis: Secondary | ICD-10-CM

## 2018-07-07 DIAGNOSIS — R1013 Epigastric pain: Secondary | ICD-10-CM

## 2018-07-07 MED ORDER — RANITIDINE HCL 300 MG PO TABS: 300.0000 mg | ORAL_TABLET | Freq: Every day | ORAL | 2 refills | Status: DC

## 2018-07-07 NOTE — Progress Notes (Signed)
Subjective:     Victoria Caldwell is a 52 y.o. female and is here for a comprehensive physical exam. The patient reports problems - worsening reflux-- she is not good about taking the zantac bid..    She also never had the sleep study and is willing to go now and have it done  Social History   Socioeconomic History  . Marital status: Married    Spouse name: Not on file  . Number of children: Not on file  . Years of education: Not on file  . Highest education level: Not on file  Occupational History  . Not on file  Social Needs  . Financial resource strain: Not on file  . Food insecurity:    Worry: Not on file    Inability: Not on file  . Transportation needs:    Medical: Not on file    Non-medical: Not on file  Tobacco Use  . Smoking status: Never Smoker  . Smokeless tobacco: Never Used  Substance and Sexual Activity  . Alcohol use: Yes    Alcohol/week: 1.0 standard drinks    Types: 1 Glasses of wine per week    Comment: daily  . Drug use: No  . Sexual activity: Not on file  Lifestyle  . Physical activity:    Days per week: Not on file    Minutes per session: Not on file  . Stress: Not on file  Relationships  . Social connections:    Talks on phone: Not on file    Gets together: Not on file    Attends religious service: Not on file    Active member of club or organization: Not on file    Attends meetings of clubs or organizations: Not on file    Relationship status: Not on file  . Intimate partner violence:    Fear of current or ex partner: Not on file    Emotionally abused: Not on file    Physically abused: Not on file    Forced sexual activity: Not on file  Other Topics Concern  . Not on file  Social History Narrative   Exercise   Health Maintenance  Topic Date Due  . MAMMOGRAM  02/12/2018  . INFLUENZA VACCINE  05/29/2018  . PAP SMEAR  12/30/2018  . TETANUS/TDAP  10/29/2020  . COLONOSCOPY  02/26/2026  . Hepatitis C Screening  Completed  . HIV  Screening  Completed    The following portions of the patient's history were reviewed and updated as appropriate:  She  has a past medical history of Chickenpox and No pertinent past medical history. She does not have any pertinent problems on file. She  has a past surgical history that includes Back surgery; Laparoscopic supracervical hysterectomy (06/26/2011); and Salpingoophorectomy (06/26/2011). Her family history includes AAA (abdominal aortic aneurysm) in her father; Cancer (age of onset: 30) in her mother; Heart attack (age of onset: 67) in her father; Hypertension in her unknown relative; Kidney disease in her father; Ovarian cysts in her unknown relative. She  reports that she has never smoked. She has never used smokeless tobacco. She reports that she drinks about 1.0 standard drinks of alcohol per week. She reports that she does not use drugs. She has a current medication list which includes the following prescription(s): multivitamin adult and ranitidine. Current Outpatient Medications on File Prior to Visit  Medication Sig Dispense Refill  . Multiple Vitamins-Minerals (MULTIVITAMIN ADULT) CHEW Chew 2 each by mouth daily.     No  current facility-administered medications on file prior to visit.    She has No Known Allergies..  Review of Systems Review of Systems  Constitutional: Negative for activity change, appetite change and fatigue.  HENT: Negative for hearing loss, congestion, tinnitus and ear discharge.  dentist q86m Eyes: Negative for visual disturbance (see optho q1y -- vision corrected to 20/20 with glasses).  Respiratory: Negative for cough, chest tightness and shortness of breath.   Cardiovascular: Negative for chest pain, palpitations and leg swelling.  Gastrointestinal: Negative for abdominal pain, diarrhea, constipation and abdominal distention.  Genitourinary: Negative for urgency, frequency, decreased urine volume and difficulty urinating.  Musculoskeletal: Negative  for back pain, arthralgias and gait problem.  Skin: Negative for color change, pallor and rash.  Neurological: Negative for dizziness, light-headedness, numbness and headaches.  Hematological: Negative for adenopathy. Does not bruise/bleed easily.  Psychiatric/Behavioral: Negative for suicidal ideas, confusion, sleep disturbance, self-injury, dysphoric mood, decreased concentration and agitation.       Objective:    BP 129/72 (BP Location: Left Arm, Cuff Size: Normal)   Pulse 68   Temp 98.3 F (36.8 C) (Oral)   Resp 16   Ht 5\' 4"  (1.626 m)   Wt 208 lb 3.2 oz (94.4 kg)   LMP 05/28/2011   SpO2 99%   BMI 35.74 kg/m  General appearance: alert, cooperative, appears stated age and no distress Head: Normocephalic, without obvious abnormality, atraumatic Eyes: conjunctivae/corneas clear. PERRL, EOM's intact. Fundi benign. Ears: normal TM's and external ear canals both ears Nose: Nares normal. Septum midline. Mucosa normal. No drainage or sinus tenderness. Throat: lips, mucosa, and tongue normal; teeth and gums normal Neck: no adenopathy, no carotid bruit, no JVD, supple, symmetrical, trachea midline and thyroid not enlarged, symmetric, no tenderness/mass/nodules Back: symmetric, no curvature. ROM normal. No CVA tenderness. Lungs: clear to auscultation bilaterally Breasts: normal appearance, no masses or tenderness Heart: regular rate and rhythm, S1, S2 normal, no murmur, click, rub or gallop Abdomen: soft, non-tender; bowel sounds normal; no masses,  no organomegaly Pelvic: deferred Extremities: extremities normal, atraumatic, no cyanosis or edema Pulses: 2+ and symmetric Skin: Skin color, texture, turgor normal. No rashes or lesions Lymph nodes: Cervical, supraclavicular, and axillary nodes normal. Neurologic: Alert and oriented X 3, normal strength and tone. Normal symmetric reflexes. Normal coordination and gait    Assessment:    Healthy female exam.      Plan:    ghm  utd Check labs  See After Visit Summary for Counseling Recommendations    1. Breast cancer screening  - MM 3D SCREEN BREAST BILATERAL; Future  2. Preventative health care ghm utd Check labs  - CBC with Differential/Platelet - Comprehensive metabolic panel - Lipid panel - TSH - MM 3D SCREEN BREAST BILATERAL; Future  3. Dyspepsia Take zantac 300 mg qd  Consider gi referral if no better  4. Gastroesophageal reflux disease, esophagitis presence not specified  - ranitidine (ZANTAC) 300 MG tablet; Take 1 tablet (300 mg total) by mouth at bedtime.  Dispense: 30 tablet; Refill: 2 - H. pylori antibody, IgG  5. Snoring  - Ambulatory referral to Pulmonology  6. Morbid obesity ([place])  - Amb Ref to Medical Weight Management  7. Need for shingles vaccine  - Varicella-zoster vaccine IM (Shingrix)

## 2018-07-07 NOTE — Patient Instructions (Signed)
Preventive Care 40-64 Years, Female Preventive care refers to lifestyle choices and visits with your health care provider that can promote health and wellness. What does preventive care include?  A yearly physical exam. This is also called an annual well check.  Dental exams once or twice a year.  Routine eye exams. Ask your health care provider how often you should have your eyes checked.  Personal lifestyle choices, including: ? Daily care of your teeth and gums. ? Regular physical activity. ? Eating a healthy diet. ? Avoiding tobacco and drug use. ? Limiting alcohol use. ? Practicing safe sex. ? Taking low-dose aspirin daily starting at age 58. ? Taking vitamin and mineral supplements as recommended by your health care provider. What happens during an annual well check? The services and screenings done by your health care provider during your annual well check will depend on your age, overall health, lifestyle risk factors, and family history of disease. Counseling Your health care provider may ask you questions about your:  Alcohol use.  Tobacco use.  Drug use.  Emotional well-being.  Home and relationship well-being.  Sexual activity.  Eating habits.  Work and work Statistician.  Method of birth control.  Menstrual cycle.  Pregnancy history.  Screening You may have the following tests or measurements:  Height, weight, and BMI.  Blood pressure.  Lipid and cholesterol levels. These may be checked every 5 years, or more frequently if you are over 81 years old.  Skin check.  Lung cancer screening. You may have this screening every year starting at age 78 if you have a 30-pack-year history of smoking and currently smoke or have quit within the past 15 years.  Fecal occult blood test (FOBT) of the stool. You may have this test every year starting at age 65.  Flexible sigmoidoscopy or colonoscopy. You may have a sigmoidoscopy every 5 years or a colonoscopy  every 10 years starting at age 30.  Hepatitis C blood test.  Hepatitis B blood test.  Sexually transmitted disease (STD) testing.  Diabetes screening. This is done by checking your blood sugar (glucose) after you have not eaten for a while (fasting). You may have this done every 1-3 years.  Mammogram. This may be done every 1-2 years. Talk to your health care provider about when you should start having regular mammograms. This may depend on whether you have a family history of breast cancer.  BRCA-related cancer screening. This may be done if you have a family history of breast, ovarian, tubal, or peritoneal cancers.  Pelvic exam and Pap test. This may be done every 3 years starting at age 80. Starting at age 36, this may be done every 5 years if you have a Pap test in combination with an HPV test.  Bone density scan. This is done to screen for osteoporosis. You may have this scan if you are at high risk for osteoporosis.  Discuss your test results, treatment options, and if necessary, the need for more tests with your health care provider. Vaccines Your health care provider may recommend certain vaccines, such as:  Influenza vaccine. This is recommended every year.  Tetanus, diphtheria, and acellular pertussis (Tdap, Td) vaccine. You may need a Td booster every 10 years.  Varicella vaccine. You may need this if you have not been vaccinated.  Zoster vaccine. You may need this after age 5.  Measles, mumps, and rubella (MMR) vaccine. You may need at least one dose of MMR if you were born in  1959 or later. You may also need a second dose.  Pneumococcal 13-valent conjugate (PCV13) vaccine. You may need this if you have certain conditions and were not previously vaccinated.  Pneumococcal polysaccharide (PPSV23) vaccine. You may need one or two doses if you smoke cigarettes or if you have certain conditions.  Meningococcal vaccine. You may need this if you have certain  conditions.  Hepatitis A vaccine. You may need this if you have certain conditions or if you travel or work in places where you may be exposed to hepatitis A.  Hepatitis B vaccine. You may need this if you have certain conditions or if you travel or work in places where you may be exposed to hepatitis B.  Haemophilus influenzae type b (Hib) vaccine. You may need this if you have certain conditions.  Talk to your health care provider about which screenings and vaccines you need and how often you need them. This information is not intended to replace advice given to you by your health care provider. Make sure you discuss any questions you have with your health care provider. Document Released: 11/11/2015 Document Revised: 07/04/2016 Document Reviewed: 08/16/2015 Elsevier Interactive Patient Education  2018 Elsevier Inc.  

## 2018-07-08 LAB — COMPREHENSIVE METABOLIC PANEL
ALT: 18 U/L (ref 0–35)
AST: 15 U/L (ref 0–37)
Albumin: 4.4 g/dL (ref 3.5–5.2)
Alkaline Phosphatase: 56 U/L (ref 39–117)
BUN: 14 mg/dL (ref 6–23)
CHLORIDE: 104 meq/L (ref 96–112)
CO2: 28 meq/L (ref 19–32)
Calcium: 9.3 mg/dL (ref 8.4–10.5)
Creatinine, Ser: 0.74 mg/dL (ref 0.40–1.20)
GFR: 86.9 mL/min (ref >60.00)
Glucose, Bld: 86 mg/dL (ref 70–99)
POTASSIUM: 4.3 meq/L (ref 3.5–5.1)
SODIUM: 140 meq/L (ref 135–145)
TOTAL PROTEIN: 7.5 g/dL (ref 6.0–8.3)
Total Bilirubin: 0.4 mg/dL (ref 0.2–1.2)

## 2018-07-08 LAB — TSH: TSH: 3.17 u[IU]/mL (ref 0.35–4.50)

## 2018-07-08 LAB — CBC WITH DIFFERENTIAL/PLATELET
Basophils Absolute: 0.1 10*3/uL (ref 0.0–0.1)
Basophils Relative: 1.1 % (ref 0.0–3.0)
EOS PCT: 1.7 % (ref 0.0–5.0)
Eosinophils Absolute: 0.1 10*3/uL (ref 0.0–0.7)
HCT: 38.8 % (ref 36.0–46.0)
Hemoglobin: 12.7 g/dL (ref 12.0–15.0)
LYMPHS ABS: 2.6 10*3/uL (ref 0.7–4.0)
Lymphocytes Relative: 39.7 % (ref 12.0–46.0)
MCHC: 32.8 g/dL (ref 30.0–36.0)
MCV: 83.7 fl (ref 78.0–100.0)
Monocytes Absolute: 0.4 10*3/uL (ref 0.1–1.0)
Monocytes Relative: 6.4 % (ref 3.0–12.0)
Neutro Abs: 3.3 10*3/uL (ref 1.4–7.7)
Neutrophils Relative %: 51.1 % (ref 43.0–77.0)
Platelets: 235 10*3/uL (ref 150.0–400.0)
RBC: 4.64 Mil/uL (ref 3.87–5.11)
RDW: 15 % (ref 11.5–15.5)
WBC: 6.4 10*3/uL (ref 4.0–10.5)

## 2018-07-08 LAB — LIPID PANEL
CHOL/HDL RATIO: 3
Cholesterol: 178 mg/dL (ref 0–200)
HDL: 65.6 mg/dL (ref >39.00)
LDL CALC: 93 mg/dL (ref 0–99)
NONHDL: 111.97
Triglycerides: 96 mg/dL (ref 0.0–149.0)
VLDL: 19.2 mg/dL (ref 0.0–40.0)

## 2018-07-08 LAB — H. PYLORI ANTIBODY, IGG: H Pylori IgG: NEGATIVE

## 2018-07-11 ENCOUNTER — Encounter: Payer: Self-pay | Admitting: *Deleted

## 2018-07-12 ENCOUNTER — Ambulatory Visit (HOSPITAL_BASED_OUTPATIENT_CLINIC_OR_DEPARTMENT_OTHER)
Admission: RE | Admit: 2018-07-12 | Discharge: 2018-07-12 | Disposition: A | Discharge status: Home / Self Care | Payer: Federal, State, Local not specified - PPO | Source: Ambulatory Visit | Attending: Family Medicine | Admitting: Family Medicine

## 2018-07-12 ENCOUNTER — Encounter (HOSPITAL_BASED_OUTPATIENT_CLINIC_OR_DEPARTMENT_OTHER): Payer: Self-pay

## 2018-07-12 DIAGNOSIS — Z0000 Encounter for general adult medical examination without abnormal findings: Secondary | ICD-10-CM

## 2018-07-12 DIAGNOSIS — Z1231 Encounter for screening mammogram for malignant neoplasm of breast: Secondary | ICD-10-CM | POA: Insufficient documentation

## 2018-07-12 DIAGNOSIS — Z1239 Encounter for other screening for malignant neoplasm of breast: Secondary | ICD-10-CM

## 2018-08-21 ENCOUNTER — Encounter: Payer: Self-pay | Admitting: Pulmonary Disease

## 2018-08-21 ENCOUNTER — Ambulatory Visit (INDEPENDENT_AMBULATORY_CARE_PROVIDER_SITE_OTHER): Payer: Federal, State, Local not specified - PPO | Admitting: Pulmonary Disease

## 2018-08-21 VITALS — BP 128/78 | HR 66 | Ht 64.0 in | Wt 205.0 lb

## 2018-08-21 DIAGNOSIS — R0683 Snoring: Secondary | ICD-10-CM | POA: Diagnosis not present

## 2018-08-21 NOTE — Progress Notes (Signed)
   Subjective:    Patient ID: Victoria Caldwell, female    DOB: 03/19/1966, 52 y.o.   MRN: [id number]  HPI    Review of Systems  Constitutional: Negative for fever and unexpected weight change.  HENT: Positive for congestion, sore throat and trouble swallowing. Negative for dental problem, ear pain, nosebleeds, postnasal drip, rhinorrhea, sinus pressure and sneezing.   Eyes: Negative for redness and itching.  Respiratory: Negative for cough, chest tightness, shortness of breath and wheezing.   Cardiovascular: Negative for palpitations and leg swelling.  Gastrointestinal: Negative for nausea and vomiting.  Genitourinary: Negative for dysuria.  Musculoskeletal: Positive for joint swelling.  Skin: Negative for rash.  Allergic/Immunologic: Negative.  Negative for environmental allergies, food allergies and immunocompromised state.  Neurological: Negative for headaches.  Hematological: Does not bruise/bleed easily.  Psychiatric/Behavioral: Negative for dysphoric mood. The patient is not nervous/anxious.        Objective:   Physical Exam        Assessment & Plan:

## 2018-08-21 NOTE — Progress Notes (Signed)
[facility]  Chief Complaint  Patient presents with  . Sleep Consult    Referred by Dr. [name] for possible OSA. Per patient, her husband reports that she snores at night. Denies ever having a SS before. Does wake up feeling tired.     Constitutional:  BP 128/78 (BP Location: Left Arm, Patient Position: Sitting, Cuff Size: Normal)   Pulse 66   Ht 5\' 4"  (1.626 m)   Wt 205 lb (93 kg)   LMP 05/28/2011   SpO2 97%   BMI 35.19 kg/m   Past Medical History:  GERD  Brief Summary:  Victoria Caldwell is a 52 y.o. female with snoring.  Her husband has been worried about her snoring, and this has been getting worse.  She is a restless sleeper.  Her father likely had sleep apnea.  She gets dry mouth at night.  She can't sleep on her back.  She has to breath through her mouth at night.  She gets tired during the day.  She tried getting an adjustable bed, but this didn't make a difference.  She has trouble with acid reflux and this happens at night.  She gets frequent sinus and throat infections.  She has been told she has enlarged tonsils.  She goes to sleep at 9 pm.  She falls asleep in 5 minutes, but sometimes can take up to an hour.  She wakes up some times to use the bathroom.  She gets out of bed at 5 am.  She feels tired in the morning.  She denies morning headache.  She does not use anything to help her fall sleep or stay awake.  She denies sleep walking, sleep talking, bruxism, or nightmares.  There is no history of restless legs.  She denies sleep hallucinations, sleep paralysis, or cataplexy.  The Epworth score is 2 out of 24.   Physical Exam:   Appearance - well kempt  ENMT - clear nasal mucosa, midline nasal septum, no oral exudates, no LAN, trachea midline, MP 3, 2+ tonsils, low laying soft palate Respiratory - normal chest wall, normal respiratory effort, no accessory muscle use, no wheeze/rales CV - s1s2 regular rate and rhythm, no  murmurs, no peripheral edema, radial pulses symmetric GI - soft, non tender, no masses Lymph - no adenopathy noted in neck and axillary areas MSK - normal muscle strength and tone, normal gait Ext - no cyanosis, clubbing, or joint inflammation noted Skin - no rashes, lesions, or ulcers Neuro - normal strength Psych - normal mood and affect  Discussion:  She has snoring,sleep disruption, apnea, and daytime sleepiness.  Her BMI is > 35.  She has symptoms of reflux, and this is worse at night.  I am concerned she could have sleep apnea.  Assessment/Plan:   Snoring with excessive daytime sleepiness. - will need to arrange for a home sleep study  Obesity. - discussed how weight can impact sleep and risk for sleep disordered breathing - discussed options to assist with weight loss: combination of diet modification, cardiovascular and strength training exercises  Cardiovascular risk. - had an extensive discussion regarding the adverse health consequences related to untreated sleep disordered breathing - specifically discussed the risks for hypertension, coronary artery disease, cardiac dysrhythmias, cerebrovascular disease, and diabetes - lifestyle modification discussed  Safe driving practices. - discussed how sleep disruption can increase risk of accidents, particularly when driving - safe driving practices were discussed  Therapies for obstructive sleep apnea. - if the  sleep study shows significant sleep apnea, then various therapies for treatment were reviewed: CPAP, oral appliance, and surgical interventions   Patient Instructions  Will arrange for home sleep study Will call to arrange for follow up after sleep study reviewed     Chesley Mires, MD [place] Pager: [phone] 08/21/2018, 9:32 AM  Flow Sheet      Sleep tests:    Review of Systems:  Constitutional: Negative for fever and unexpected weight change.  HENT: Positive for congestion,  sore throat and trouble swallowing. Negative for dental problem, ear pain, nosebleeds, postnasal drip, rhinorrhea, sinus pressure and sneezing.   Eyes: Negative for redness and itching.  Respiratory: Negative for cough, chest tightness, shortness of breath and wheezing.   Cardiovascular: Negative for palpitations and leg swelling.  Gastrointestinal: Negative for nausea and vomiting.  Genitourinary: Negative for dysuria.  Musculoskeletal: Positive for joint swelling.  Skin: Negative for rash.  Allergic/Immunologic: Negative.  Negative for environmental allergies, food allergies and immunocompromised state.  Neurological: Negative for headaches.  Hematological: Does not bruise/bleed easily.  Psychiatric/Behavioral: Negative for dysphoric mood. The patient is not nervous/anxious.    Medications:   Allergies as of 08/21/2018   No Known Allergies     Medication List        Accurate as of 08/21/18  9:32 AM. Always use your most recent med list.          MULTIVITAMIN ADULT Chew Chew 2 each by mouth daily.   ranitidine 300 MG tablet Commonly known as:  ZANTAC Take 1 tablet (300 mg total) by mouth at bedtime.       Past Surgical History:  She  has a past surgical history that includes Back surgery; Laparoscopic supracervical hysterectomy (06/26/2011); and Salpingoophorectomy (06/26/2011).  Family History:  Her family history includes AAA (abdominal aortic aneurysm) in her father; Cancer (age of onset: 62) in her mother; Heart attack (age of onset: 15) in her father; Hypertension in her unknown relative; Kidney disease in her father; Ovarian cysts in her unknown relative.  Social History:  She  reports that she has never smoked. She has never used smokeless tobacco. She reports that she drinks about 1.0 standard drinks of alcohol per week. She reports that she does not use drugs.

## 2018-08-21 NOTE — Patient Instructions (Signed)
Will arrange for home sleep study Will call to arrange for follow up after sleep study reviewed  

## 2018-09-04 ENCOUNTER — Encounter (INDEPENDENT_AMBULATORY_CARE_PROVIDER_SITE_OTHER): Payer: Self-pay

## 2018-09-08 DIAGNOSIS — G4733 Obstructive sleep apnea (adult) (pediatric): Secondary | ICD-10-CM | POA: Diagnosis not present

## 2018-09-09 ENCOUNTER — Other Ambulatory Visit: Payer: Self-pay | Admitting: *Deleted

## 2018-09-09 DIAGNOSIS — R0683 Snoring: Secondary | ICD-10-CM

## 2018-09-10 ENCOUNTER — Telehealth: Payer: Self-pay | Admitting: Pulmonary Disease

## 2018-09-10 ENCOUNTER — Encounter: Payer: Self-pay | Admitting: Pulmonary Disease

## 2018-09-10 DIAGNOSIS — G4733 Obstructive sleep apnea (adult) (pediatric): Secondary | ICD-10-CM | POA: Diagnosis not present

## 2018-09-10 HISTORY — DX: Obstructive sleep apnea (adult) (pediatric): G47.33

## 2018-09-10 NOTE — Telephone Encounter (Signed)
HST 09/08/18 >> AHI 13.8, SpO2 low 88%.   Please let her know that the sleep study showed mild obstructive sleep apnea.  Please arrange for ROV with a nurse practitioner to review treatment options.

## 2018-09-15 NOTE — Telephone Encounter (Signed)
Called and spoke with patient regarding results.  Informed the patient of results and recommendations today. Scheduled OV with TP for 09/18/18 at 11:30am [place] office Pt verbalized understanding and denied any questions or concerns at this time.  Nothing further needed.

## 2018-09-16 ENCOUNTER — Ambulatory Visit (INDEPENDENT_AMBULATORY_CARE_PROVIDER_SITE_OTHER): Payer: Federal, State, Local not specified - PPO

## 2018-09-16 DIAGNOSIS — Z23 Encounter for immunization: Secondary | ICD-10-CM | POA: Diagnosis not present

## 2018-09-18 ENCOUNTER — Encounter: Payer: Self-pay | Admitting: Adult Health

## 2018-09-18 ENCOUNTER — Ambulatory Visit (INDEPENDENT_AMBULATORY_CARE_PROVIDER_SITE_OTHER): Payer: Federal, State, Local not specified - PPO | Admitting: Family Medicine

## 2018-09-18 ENCOUNTER — Ambulatory Visit: Payer: Federal, State, Local not specified - PPO | Admitting: Adult Health

## 2018-09-18 ENCOUNTER — Encounter (INDEPENDENT_AMBULATORY_CARE_PROVIDER_SITE_OTHER): Payer: Self-pay | Admitting: Family Medicine

## 2018-09-18 VITALS — BP 131/80 | HR 61 | Temp 98.0°F | Ht 64.0 in | Wt 201.0 lb

## 2018-09-18 VITALS — BP 118/70 | HR 66 | Ht 64.0 in | Wt 205.2 lb

## 2018-09-18 DIAGNOSIS — Z1331 Encounter for screening for depression: Secondary | ICD-10-CM | POA: Diagnosis not present

## 2018-09-18 DIAGNOSIS — R0602 Shortness of breath: Secondary | ICD-10-CM | POA: Diagnosis not present

## 2018-09-18 DIAGNOSIS — G4730 Sleep apnea, unspecified: Secondary | ICD-10-CM | POA: Diagnosis not present

## 2018-09-18 DIAGNOSIS — J310 Chronic rhinitis: Secondary | ICD-10-CM | POA: Diagnosis not present

## 2018-09-18 DIAGNOSIS — R5383 Other fatigue: Secondary | ICD-10-CM | POA: Diagnosis not present

## 2018-09-18 DIAGNOSIS — E669 Obesity, unspecified: Secondary | ICD-10-CM

## 2018-09-18 DIAGNOSIS — Z9189 Other specified personal risk factors, not elsewhere classified: Secondary | ICD-10-CM

## 2018-09-18 DIAGNOSIS — Z0289 Encounter for other administrative examinations: Secondary | ICD-10-CM

## 2018-09-18 DIAGNOSIS — G4733 Obstructive sleep apnea (adult) (pediatric): Secondary | ICD-10-CM | POA: Diagnosis not present

## 2018-09-18 DIAGNOSIS — Z6834 Body mass index (BMI) 34.0-34.9, adult: Secondary | ICD-10-CM

## 2018-09-18 NOTE — Assessment & Plan Note (Signed)
Rhinitis symptoms enlarged tonsils have suggested patient use maintenance medications If not improving may need ENT referral  Plan  Patient Instructions  Refer for oral appliance for mild sleep apnea. -Dr. Ron Parker .  Work on healthy weight Do not drive a sleepy Flonase 2 puffs daily  Saline nasal rinses Twice daily   Zyrtec 10mg  At bedtime   May use snore strips At bedtime   Follow-up with Dr. Halford Chessman  Or Aubrei Bouchie NP in 6 months and As needed

## 2018-09-18 NOTE — Progress Notes (Signed)
@Patient  ID: Victoria Caldwell, female    DOB: 1966/04/09, 52 y.o.   MRN: [id number]  Chief Complaint  Patient presents with  . Follow-up    OSA     Referring provider: Ann Held, *  HPI: 52 year old female seen for sleep consult August 21, 2018 found to have mild sleep apnea  TEST/EVENTS :  Home sleep study September 08, 2018 >> AHI 13.8, SPO2 low 88%  09/18/2018 Follow up : OSA  Patient presents for a one-month follow-up.  Patient was seen last visit for a sleep consult for possible sleep apnea.  Patient was set up for home sleep study that was completed on September 08, 2018 that showed mild sleep apnea with a AHI 13.8.  SPO2 low at 88%.  We went over sleep study results.  Discussion regarding sleep apnea and potential complications of untreated sleep apnea.  We went over treatment options including weight loss, oral appliance, CPAP.  Patient would like to proceed with oral appliance  Is working with weight loss clinic .    No Known Allergies  Immunization History  Administered Date(s) Administered  . Influenza Split 08/28/2018  . Influenza-Unspecified 07/30/2015  . Tdap 10/29/2010  . Zoster Recombinat (Shingrix) 07/07/2018, 09/16/2018    Past Medical History:  Diagnosis Date  . Back pain   . Chickenpox   . GERD (gastroesophageal reflux disease)   . Joint pain   . OSA (obstructive sleep apnea) 09/10/2018  . Sinus problem     Tobacco History: Social History   Tobacco Use  Smoking Status Never Smoker  Smokeless Tobacco Never Used   Counseling given: Not Answered   Outpatient Medications Prior to Visit  Medication Sig Dispense Refill  . Multiple Vitamins-Minerals (MULTIVITAMIN ADULT) CHEW Chew 2 each by mouth daily.    . ranitidine (ZANTAC) 300 MG tablet Take 1 tablet (300 mg total) by mouth at bedtime. 30 tablet 2  . cetirizine (ZYRTEC) 10 MG tablet Take 10 mg by mouth daily.     No facility-administered medications prior to visit.       Review of Systems  Constitutional:   No  weight loss, night sweats,  Fevers, chills, fatigue, or  lassitude.  HEENT:   No headaches,  Difficulty swallowing,  Tooth/dental problems, or  Sore throat,                No sneezing, itching, ear ache,  +nasal congestion, post nasal drip,   CV:  No chest pain,  Orthopnea, PND, swelling in lower extremities, anasarca, dizziness, palpitations, syncope.   GI  No heartburn, indigestion, abdominal pain, nausea, vomiting, diarrhea, change in bowel habits, loss of appetite, bloody stools.   Resp: No shortness of breath with exertion or at rest.  No excess mucus, no productive cough,  No non-productive cough,  No coughing up of blood.  No change in color of mucus.  No wheezing.  No chest wall deformity  Skin: no rash or lesions.  GU: no dysuria, change in color of urine, no urgency or frequency.  No flank pain, no hematuria   MS:  No joint pain or swelling.  No decreased range of motion.  No back pain.    Physical Exam  BP 118/70 (BP Location: Left Arm, Cuff Size: Normal)   Pulse 66   Ht 5\' 4"  (1.626 m)   Wt 205 lb 3.2 oz (93.1 kg)   LMP 05/28/2011   SpO2 97%   BMI 35.22 kg/m   GEN: A/Ox3;  pleasant , NAD, obese    HEENT:  [place]/AT,  EACs-clear, TMs-wnl, NOSE-clear drainage  THROAT-clear, no lesions, no postnasal drip or exudate noted. Class 2-3  MP airway tonsils 2+  NECK:  Supple w/ fair ROM; no JVD; normal carotid impulses w/o bruits; no thyromegaly or nodules palpated; no lymphadenopathy.    RESP  Clear  P & A; w/o, wheezes/ rales/ or rhonchi. no accessory muscle use, no dullness to percussion  CARD:  RRR, no m/r/g, no peripheral edema, pulses intact, no cyanosis or clubbing.  GI:   Soft & nt; nml bowel sounds; no organomegaly or masses detected.   Musco: Warm bil, no deformities or joint swelling noted.   Neuro: alert, no focal deficits noted.    Skin: Warm, no lesions or rashes    Lab Results:  CBC    Component  Value Date/Time   WBC 6.4 07/07/2018 1545   RBC 4.64 07/07/2018 1545   HGB 12.7 07/07/2018 1545   HCT 38.8 07/07/2018 1545   PLT 235.0 07/07/2018 1545   MCV 83.7 07/07/2018 1545   MCH 27.5 12/30/2015 1648   MCHC 32.8 07/07/2018 1545   RDW 15.0 07/07/2018 1545   LYMPHSABS 2.6 07/07/2018 1545   MONOABS 0.4 07/07/2018 1545   EOSABS 0.1 07/07/2018 1545   BASOSABS 0.1 07/07/2018 1545    BMET    Component Value Date/Time   NA 140 07/07/2018 1545   K 4.3 07/07/2018 1545   CL 104 07/07/2018 1545   CO2 28 07/07/2018 1545   GLUCOSE 86 07/07/2018 1545   BUN 14 07/07/2018 1545   CREATININE 0.74 07/07/2018 1545   CREATININE 0.74 12/30/2015 1648   CALCIUM 9.3 07/07/2018 1545    BNP No results found for: BNP  ProBNP No results found for: PROBNP  Imaging: No results found.    No flowsheet data found.  No results found for: NITRICOXIDE      Assessment & Plan:   OSA (obstructive sleep apnea) Mild OSA-  Patient education on sleep apnea, treatment options and healthy lifestyle  Plan  Patient Instructions  Refer for oral appliance for mild sleep apnea. -Dr. Ron Parker .  Work on healthy weight Do not drive a sleepy Flonase 2 puffs daily  Saline nasal rinses Twice daily   Zyrtec 10mg  At bedtime   May use snore strips At bedtime   Follow-up with Dr. Halford Chessman  Or Toni Demo NP in 6 months and As needed       Rhinitis Rhinitis symptoms enlarged tonsils have suggested patient use maintenance medications If not improving may need ENT referral  Plan  Patient Instructions  Refer for oral appliance for mild sleep apnea. -Dr. Ron Parker .  Work on healthy weight Do not drive a sleepy Flonase 2 puffs daily  Saline nasal rinses Twice daily   Zyrtec 10mg  At bedtime   May use snore strips At bedtime   Follow-up with Dr. Halford Chessman  Or Amy Belloso NP in 6 months and As needed          Rexene Edison, NP 09/18/2018

## 2018-09-18 NOTE — Patient Instructions (Addendum)
Refer for oral appliance for mild sleep apnea. -Dr. Ron Parker .  Work on healthy weight Do not drive a sleepy Flonase 2 puffs daily  Saline nasal rinses Twice daily   Zyrtec 10mg  At bedtime   May use snore strips At bedtime   Follow-up with Dr. Halford Chessman  Or Valda Christenson NP in 6 months and As needed

## 2018-09-18 NOTE — Assessment & Plan Note (Signed)
Mild OSA-  Patient education on sleep apnea, treatment options and healthy lifestyle  Plan  Patient Instructions  Refer for oral appliance for mild sleep apnea. -Dr. Ron Parker .  Work on healthy weight Do not drive a sleepy Flonase 2 puffs daily  Saline nasal rinses Twice daily   Zyrtec 10mg  At bedtime   May use snore strips At bedtime   Follow-up with Dr. Halford Chessman  Or Mirah Nevins NP in 6 months and As needed

## 2018-09-19 LAB — COMPREHENSIVE METABOLIC PANEL
A/G RATIO: 1.9 (ref 1.2–2.2)
ALT: 20 IU/L (ref 0–32)
AST: 21 IU/L (ref 0–40)
Albumin: 4.7 g/dL (ref 3.5–5.5)
Alkaline Phosphatase: 67 IU/L (ref 39–117)
BUN/Creatinine Ratio: 13 (ref 9–23)
BUN: 11 mg/dL (ref 6–24)
Bilirubin Total: 0.3 mg/dL (ref 0.0–1.2)
CALCIUM: 9.4 mg/dL (ref 8.7–10.2)
CO2: 21 mmol/L (ref 20–29)
Chloride: 101 mmol/L (ref 96–106)
Creatinine, Ser: 0.83 mg/dL (ref 0.57–1.00)
GFR, EST AFRICAN AMERICAN: 92 mL/min/{1.73_m2} (ref >59)
GFR, EST NON AFRICAN AMERICAN: 80 mL/min/{1.73_m2} (ref >59)
Globulin, Total: 2.5 g/dL (ref 1.5–4.5)
Glucose: 83 mg/dL (ref 65–99)
POTASSIUM: 4.3 mmol/L (ref 3.5–5.2)
Sodium: 141 mmol/L (ref 134–144)
TOTAL PROTEIN: 7.2 g/dL (ref 6.0–8.5)

## 2018-09-19 LAB — CBC WITH DIFFERENTIAL
BASOS: 1 %
Basophils Absolute: 0 10*3/uL (ref 0.0–0.2)
EOS (ABSOLUTE): 0.1 10*3/uL (ref 0.0–0.4)
EOS: 2 %
HEMATOCRIT: 38.2 % (ref 34.0–46.6)
Hemoglobin: 12.3 g/dL (ref 11.1–15.9)
IMMATURE GRANS (ABS): 0 10*3/uL (ref 0.0–0.1)
Immature Granulocytes: 0 %
LYMPHS: 41 %
Lymphocytes Absolute: 1.9 10*3/uL (ref 0.7–3.1)
MCH: 27.3 pg (ref 26.6–33.0)
MCHC: 32.2 g/dL (ref 31.5–35.7)
MCV: 85 fL (ref 79–97)
Monocytes Absolute: 0.5 10*3/uL (ref 0.1–0.9)
Monocytes: 11 %
NEUTROS PCT: 45 %
Neutrophils Absolute: 2.2 10*3/uL (ref 1.4–7.0)
RBC: 4.5 x10E6/uL (ref 3.77–5.28)
RDW: 14.9 % (ref 12.3–15.4)
WBC: 4.7 10*3/uL (ref 3.4–10.8)

## 2018-09-19 LAB — HEMOGLOBIN A1C
ESTIMATED AVERAGE GLUCOSE: 117 mg/dL
Hgb A1c MFr Bld: 5.7 % — ABNORMAL HIGH (ref 4.8–5.6)

## 2018-09-19 LAB — VITAMIN D 25 HYDROXY (VIT D DEFICIENCY, FRACTURES): Vit D, 25-Hydroxy: 33.2 ng/mL (ref 30.0–100.0)

## 2018-09-19 LAB — TSH: TSH: 3.15 u[IU]/mL (ref 0.450–4.500)

## 2018-09-19 LAB — T3: T3, Total: 108 ng/dL (ref 71–180)

## 2018-09-19 LAB — LIPID PANEL WITH LDL/HDL RATIO
CHOLESTEROL TOTAL: 170 mg/dL (ref 100–199)
HDL: 73 mg/dL (ref >39)
LDL Calculated: 85 mg/dL (ref 0–99)
LDL/HDL RATIO: 1.2 ratio (ref 0.0–3.2)
TRIGLYCERIDES: 62 mg/dL (ref 0–149)
VLDL Cholesterol Cal: 12 mg/dL (ref 5–40)

## 2018-09-19 LAB — VITAMIN B12: Vitamin B-12: 540 pg/mL (ref 232–1245)

## 2018-09-19 LAB — FOLATE: Folate: >20 ng/mL (ref >3.0)

## 2018-09-19 LAB — T4, FREE: FREE T4: 1.11 ng/dL (ref 0.82–1.77)

## 2018-09-19 LAB — INSULIN, RANDOM: INSULIN: 4.7 u[IU]/mL (ref 2.6–24.9)

## 2018-09-19 NOTE — Progress Notes (Signed)
Reviewed and agree with assessment/plan.   Sundra Haddix, MD [facility] 10/24/2016, 12:24 PM Pager:  [phone]  

## 2018-09-23 NOTE — Progress Notes (Signed)
Office: [phone]  /  Fax: [phone]   Dear Dr. [name],   Thank you for referring Jamal Collin to our clinic. The following note includes my evaluation and treatment recommendations.  HPI:   Chief Complaint: OBESITY    Kamryn A Semidey has been referred by Ann Held, DO for consultation regarding her obesity and obesity related comorbidities.    LISSETTE SCHENK (MR# [id number]) is a 52 y.o. female who presents on 09/23/2018 for obesity evaluation and treatment. Current BMI is Body mass index is 34.5 kg/m.Marland Kitchen Jereline has been struggling with her weight for many years and has been unsuccessful in either losing weight, maintaining weight loss, or reaching her healthy weight goal.     Emilia attended our information session and states she is currently in the action stage of change and ready to dedicate time achieving and maintaining a healthier weight. Iveliz is interested in becoming our patient and working on intensive lifestyle modifications including (but not limited to) diet, exercise and weight loss.    Aileena states her family eats meals together she thinks her family will eat healthier with  her she struggles with family and or friends weight loss sabotage her desired weight loss is 51 lbs. she started gaining weight after hysterectomy her heaviest weight ever was 251 lbs. she has significant food cravings issues  she has binge eating behaviors she struggles with emotional eating    Fatigue Melady feels her energy is lower than it should be. This has worsened with weight gain and has not worsened recently. Dixie denies daytime somnolence and she admits to waking up still tired. Patient has a recent diagnosis of obstructive sleep apnea, which may contribute to her fatigue. Patent has a history of symptoms of morning fatigue. Patient generally gets 8 hours of sleep per night, and states they generally have restful sleep. Snoring is present. Apneic episodes are  present. Epworth Sleepiness Score is 0  Dyspnea on exertion Ethlyn notes increasing shortness of breath with exercising and seems to be worsening over time with weight gain. She notes getting out of breath sooner with activity than she used to. This has not gotten worse recently. Ioana denies orthopnea.  Sleep Apnea Jeslie has a recent diagnosis of sleep apnea and was told it was mild. Cecille Rubin has an appointment to see her treatment options.  At risk for cardiovascular disease Noa is at a higher than average risk for cardiovascular disease due to obesity and sleep apnea. She currently denies any chest pain.  Depression Screen Jamecia's Food and Mood (modified PHQ-9) score was  Depression screen PHQ 2/9 09/18/2018  Decreased Interest 2  Down, Depressed, Hopeless 2  PHQ - 2 Score 4  Altered sleeping 2  Tired, decreased energy 2  Change in appetite 1  Feeling bad or failure about yourself  1  Trouble concentrating 0  Moving slowly or fidgety/restless 0  Suicidal thoughts 0  PHQ-9 Score 10  Difficult doing work/chores Not difficult at all    ALLERGIES: No Known Allergies  MEDICATIONS: Current Outpatient Medications on File Prior to Visit  Medication Sig Dispense Refill  . cetirizine (ZYRTEC) 10 MG tablet Take 10 mg by mouth daily.    . Multiple Vitamins-Minerals (MULTIVITAMIN ADULT) CHEW Chew 2 each by mouth daily.    . ranitidine (ZANTAC) 300 MG tablet Take 1 tablet (300 mg total) by mouth at bedtime. 30 tablet 2   No current facility-administered medications on file prior to visit.     PAST  MEDICAL HISTORY: Past Medical History:  Diagnosis Date  . Back pain   . Chickenpox   . GERD (gastroesophageal reflux disease)   . Joint pain   . OSA (obstructive sleep apnea) 09/10/2018  . Sinus problem     PAST SURGICAL HISTORY: Past Surgical History:  Procedure Laterality Date  . BACK SURGERY    . LAPAROSCOPIC SUPRACERVICAL HYSTERECTOMY  06/26/2011   Procedure: LAPAROSCOPIC  SUPRACERVICAL HYSTERECTOMY;  Surgeon: Logan Bores;  Location: [facility];  Service: Gynecology;  Laterality: N/A;  . SALPINGOOPHORECTOMY  06/26/2011   Procedure: SALPINGO OOPHERECTOMY;  Surgeon: Logan Bores;  Location: [facility];  Service: Gynecology;  Laterality: Bilateral;    SOCIAL HISTORY: Social History   Tobacco Use  . Smoking status: Never Smoker  . Smokeless tobacco: Never Used  Substance Use Topics  . Alcohol use: Yes    Alcohol/week: 1.0 standard drinks    Types: 1 Glasses of wine per week    Comment: daily  . Drug use: No    FAMILY HISTORY: Family History  Problem Relation Age of Onset  . Hypertension Unknown   . Ovarian cysts Unknown   . Cancer Mother 89       ovarian cancer  . Heart attack Father 2  . Kidney disease Father   . AAA (abdominal aortic aneurysm) Father   . Hypertension Father   . Sudden death Father   . Alcoholism Father   . Obesity Father   . Colon cancer Neg Hx   . Esophageal cancer Neg Hx   . Rectal cancer Neg Hx   . Stomach cancer Neg Hx     ROS: Review of Systems  Constitutional: Positive for malaise/fatigue.       + Swollen Glands (neck)  HENT: Positive for sinus pain and tinnitus.        + Difficult or Painful Swallowing  Eyes:       + Wear Glasses (readers)  Respiratory: Positive for shortness of breath (on exertion).   Cardiovascular: Negative for chest pain and orthopnea.  Gastrointestinal: Positive for heartburn.  Musculoskeletal:       + Muscle or Joint Pain + Muscle Stiffness  Endo/Heme/Allergies:       + Heat of Cold Intolerance    PHYSICAL EXAM: Blood pressure 131/80, pulse 61, temperature 98 F (36.7 C), temperature source Oral, height 5\' 4"  (1.626 m), weight 201 lb (91.2 kg), last menstrual period 05/28/2011, SpO2 99 %. Body mass index is 34.5 kg/m. Physical Exam  Constitutional: She is oriented to person, place, and time. She appears well-developed and well-nourished.  HENT:  Head: Normocephalic and  atraumatic.  Nose: Nose normal.  Eyes: EOM are normal. No scleral icterus.  Neck: Normal range of motion. Neck supple. No thyromegaly present.  Cardiovascular: Normal rate and regular rhythm.  Pulmonary/Chest: Effort normal. No respiratory distress.  Abdominal: Soft. There is no tenderness.  + Obesity  Musculoskeletal: Normal range of motion.  Range of Motion normal in all 4 extremities  Neurological: She is alert and oriented to person, place, and time. Coordination normal.  Skin: Skin is warm and dry.  Psychiatric: She has a normal mood and affect.  Vitals reviewed.   RECENT LABS AND TESTS: BMET    Component Value Date/Time   NA 141 09/18/2018 0957   K 4.3 09/18/2018 0957   CL 101 09/18/2018 0957   CO2 21 09/18/2018 0957   GLUCOSE 83 09/18/2018 0957   GLUCOSE 86 07/07/2018 1545   BUN 11 09/18/2018  0957   CREATININE 0.83 09/18/2018 0957   CREATININE 0.74 12/30/2015 1648   CALCIUM 9.4 09/18/2018 0957   GFRNONAA 80 09/18/2018 0957   GFRAA 92 09/18/2018 0957   Lab Results  Component Value Date   HGBA1C 5.7 (H) 09/18/2018   Lab Results  Component Value Date   INSULIN 4.7 09/18/2018   CBC    Component Value Date/Time   WBC 4.7 09/18/2018 0957   WBC 6.4 07/07/2018 1545   RBC 4.50 09/18/2018 0957   RBC 4.64 07/07/2018 1545   HGB 12.3 09/18/2018 0957   HCT 38.2 09/18/2018 0957   PLT 235.0 07/07/2018 1545   MCV 85 09/18/2018 0957   MCH 27.3 09/18/2018 0957   MCH 27.5 12/30/2015 1648   MCHC 32.2 09/18/2018 0957   MCHC 32.8 07/07/2018 1545   RDW 14.9 09/18/2018 0957   LYMPHSABS 1.9 09/18/2018 0957   MONOABS 0.4 07/07/2018 1545   EOSABS 0.1 09/18/2018 0957   BASOSABS 0.0 09/18/2018 0957   Iron/TIBC/Ferritin/ %Sat No results found for: IRON, TIBC, FERRITIN, IRONPCTSAT Lipid Panel     Component Value Date/Time   CHOL 170 09/18/2018 0957   TRIG 62 09/18/2018 0957   HDL 73 09/18/2018 0957   CHOLHDL 3 07/07/2018 1545   VLDL 19.2 07/07/2018 1545   LDLCALC 85  09/18/2018 0957   Hepatic Function Panel     Component Value Date/Time   PROT 7.2 09/18/2018 0957   ALBUMIN 4.7 09/18/2018 0957   AST 21 09/18/2018 0957   ALT 20 09/18/2018 0957   ALKPHOS 67 09/18/2018 0957   BILITOT 0.3 09/18/2018 0957      Component Value Date/Time   TSH 3.150 09/18/2018 0957   TSH 3.17 07/07/2018 1545   TSH 2.94 05/14/2017 1409    ECG  shows NSR with a rate of 67 BPM INDIRECT CALORIMETER done today shows a VO2 of 230 and a REE of 1601.  Her calculated basal metabolic rate is 1856 thus her basal metabolic rate is better than expected.    ASSESSMENT AND PLAN: Other fatigue - Plan: EKG 12-Lead, Vitamin B12, CBC With Differential, Comprehensive metabolic panel, Folate, Hemoglobin A1c, Insulin, random, Lipid Panel With LDL/HDL Ratio, T3, T4, free, TSH, VITAMIN D 25 Hydroxy (Vit-D Deficiency, Fractures)  SOB (shortness of breath) on exertion - Plan: Vitamin B12, CBC With Differential, Comprehensive metabolic panel, Folate, Hemoglobin A1c, Insulin, random, Lipid Panel With LDL/HDL Ratio, T3, T4, free, TSH, VITAMIN D 25 Hydroxy (Vit-D Deficiency, Fractures)  Obstructive sleep apnea syndrome  Depression screening  At risk for heart disease  Class 1 obesity with serious comorbidity and body mass index (BMI) of 34.0 to 34.9 in adult, unspecified obesity type  PLAN: Fatigue Yoana was informed that her fatigue may be related to obesity, depression or many other causes. Labs will be ordered, and in the meanwhile Brisia has agreed to work on diet, exercise and weight loss to help with fatigue. Proper sleep hygiene was discussed including the need for 7-8 hours of quality sleep each night.   Dyspnea on exertion Leetta's shortness of breath appears to be obesity related and exercise induced. She has agreed to work on weight loss and gradually increase exercise to treat her exercise induced shortness of breath. If Kinisha follows our instructions and loses weight without  improvement of her shortness of breath, we will plan to refer to pulmonology. We will monitor this condition regularly. Latanga agrees to this plan.  Sleep Apnea Cecille Rubin will start to work on diet, exercise and  weight loss to help with sleep apnea. She agrees to follow up with our clinic in 2 weeks.  Cardiovascular risk counseling Breawna was given extended (15 minutes) coronary artery disease prevention counseling today. She is 52 y.o. female and has risk factors for heart disease including obesity and sleep apnea. We discussed intensive lifestyle modifications today with an emphasis on specific weight loss instructions and strategies. Pt was also informed of the importance of increasing exercise and decreasing saturated fats to help prevent heart disease.  Depression Screen Alis had a moderately positive depression screening. Depression is commonly associated with obesity and often results in emotional eating behaviors. We will monitor this closely and work on CBT to help improve the non-hunger eating patterns. Referral to Psychology may be required if no improvement is seen as she continues in our clinic.  Obesity Brea is currently in the action stage of change and her goal is to continue with weight loss efforts. I recommend Graham begin the structured treatment plan as follows:  She has agreed to follow the Category 2 plan +100 calories Bich has been instructed to eventually work up to a goal of 150 minutes of combined cardio and strengthening exercise per week for weight loss and overall health benefits. We discussed the following Behavioral Modification Strategies today: increasing lean protein intake and decreasing simple carbohydrates    She was informed of the importance of frequent follow up visits to maximize her success with intensive lifestyle modifications for her multiple health conditions. She was informed we would discuss her lab results at her next visit unless there is a critical  issue that needs to be addressed sooner. Claramae agreed to keep her next visit at the agreed upon time to discuss these results.    OBESITY BEHAVIORAL INTERVENTION VISIT  Today's visit was # 1   Starting weight: 201 lbs Starting date: 09/18/2018 Today's weight : 201 lbs Today's date: 09/18/2018 Total lbs lost to date: 0   ASK: We discussed the diagnosis of obesity with Jamal Collin today and Camilia agreed to give [place] permission to discuss obesity behavioral modification therapy today.  ASSESS: Hatsue has the diagnosis of obesity and her BMI today is 34.48 Randal is in the action stage of change   ADVISE: Cyann was educated on the multiple health risks of obesity as well as the benefit of weight loss to improve her health. She was advised of the need for long term treatment and the importance of lifestyle modifications to improve her current health and to decrease her risk of future health problems.  AGREE: Multiple dietary modification options and treatment options were discussed and  Shawnna agreed to follow the recommendations documented in the above note.  ARRANGE: Oval was educated on the importance of frequent visits to treat obesity as outlined per CMS and USPSTF guidelines and agreed to schedule her next follow up appointment today.  I, Doreene Nest, am acting as transcriptionist for Dennard Nip, MD  I have reviewed the above documentation for accuracy and completeness, and I agree with the above. -Dennard Nip, MD

## 2018-09-27 ENCOUNTER — Encounter: Payer: Self-pay | Admitting: Family Medicine

## 2018-09-29 MED ORDER — FAMOTIDINE 40 MG PO TABS: 40.0000 mg | ORAL_TABLET | Freq: Every day | ORAL | 3 refills | Status: DC

## 2018-09-29 NOTE — Telephone Encounter (Signed)
Switch to pepcid 40 mg #90 1 po qd 3 refills

## 2018-09-30 ENCOUNTER — Encounter (INDEPENDENT_AMBULATORY_CARE_PROVIDER_SITE_OTHER): Payer: Self-pay | Admitting: Family Medicine

## 2018-10-06 ENCOUNTER — Ambulatory Visit (INDEPENDENT_AMBULATORY_CARE_PROVIDER_SITE_OTHER): Payer: Federal, State, Local not specified - PPO | Admitting: Family Medicine

## 2018-10-06 VITALS — BP 113/75 | HR 59 | Temp 98.2°F | Ht 64.0 in | Wt 196.0 lb

## 2018-10-06 DIAGNOSIS — Z9189 Other specified personal risk factors, not elsewhere classified: Secondary | ICD-10-CM | POA: Diagnosis not present

## 2018-10-06 DIAGNOSIS — Z6833 Body mass index (BMI) 33.0-33.9, adult: Secondary | ICD-10-CM

## 2018-10-06 DIAGNOSIS — E559 Vitamin D deficiency, unspecified: Secondary | ICD-10-CM

## 2018-10-06 DIAGNOSIS — R7303 Prediabetes: Secondary | ICD-10-CM

## 2018-10-06 DIAGNOSIS — E669 Obesity, unspecified: Secondary | ICD-10-CM | POA: Diagnosis not present

## 2018-10-06 MED ORDER — VITAMIN D (ERGOCALCIFEROL) 1.25 MG (50000 UNIT) PO CAPS: 50000.0000 [IU] | ORAL_CAPSULE | ORAL | 0 refills | Status: DC

## 2018-10-07 NOTE — Progress Notes (Signed)
Office: [phone]  /  Fax: [phone]   HPI:   Chief Complaint: OBESITY Victoria Caldwell is here to discuss her progress with her obesity treatment plan. She is on the Category 2 plan and is following her eating plan approximately 98 % of the time. She states she is exercising 0 minutes 0 times per week. Victoria Caldwell did very well with weight loss even over Thanksgiving. She notes increased temptations and she struggled to eat everything on her plan.  Her weight is 196 lb (88.9 kg) today and has had a weight loss of 5 pounds over a period of 2 weeks since her last visit. She has lost 5 lbs since starting treatment with [place].  Vitamin D deficiency Victoria Caldwell has a new diagnosis of vitamin D deficiency. She is currently taking a multivitamin, but is not yet at goal. She admits fatigue and denies nausea, vomiting, or muscle weakness.  Pre-Diabetes Victoria Caldwell has a new diagnosis of pre-diabetes based on her elevated Hgb A1c and was informed this puts her at greater risk of developing diabetes. Her A1c was 5.7 on 09/18/18 with a normal fasting glucose. She notes decreased polyphagia on her diet prescription. She is not taking metformin currently and continues to work on diet and exercise to decrease risk of diabetes.   Diabetes risk counseling Victoria Caldwell was given extended (15 minutes) diabetes prevention counseling today. She is 52 y.o. female and has risk factors for diabetes including pre-diabetes and obesity. We discussed intensive lifestyle modifications today with an emphasis on weight loss as well as increasing exercise and decreasing simple carbohydrates in her diet.  ALLERGIES: No Known Allergies  MEDICATIONS: Current Outpatient Medications on File Prior to Visit  Medication Sig Dispense Refill  . cetirizine (ZYRTEC) 10 MG tablet Take 10 mg by mouth daily.    . famotidine (PEPCID) 40 MG tablet Take 1 tablet (40 mg total) by mouth daily. 90 tablet 3  . Multiple Vitamins-Minerals (MULTIVITAMIN ADULT) CHEW Chew 2  each by mouth daily.     No current facility-administered medications on file prior to visit.     PAST MEDICAL HISTORY: Past Medical History:  Diagnosis Date  . Back pain   . Chickenpox   . GERD (gastroesophageal reflux disease)   . Joint pain   . OSA (obstructive sleep apnea) 09/10/2018  . Sinus problem     PAST SURGICAL HISTORY: Past Surgical History:  Procedure Laterality Date  . BACK SURGERY    . LAPAROSCOPIC SUPRACERVICAL HYSTERECTOMY  06/26/2011   Procedure: LAPAROSCOPIC SUPRACERVICAL HYSTERECTOMY;  Surgeon: Logan Bores;  Location: [facility];  Service: Gynecology;  Laterality: N/A;  . SALPINGOOPHORECTOMY  06/26/2011   Procedure: SALPINGO OOPHERECTOMY;  Surgeon: Logan Bores;  Location: [facility];  Service: Gynecology;  Laterality: Bilateral;    SOCIAL HISTORY: Social History   Tobacco Use  . Smoking status: Never Smoker  . Smokeless tobacco: Never Used  Substance Use Topics  . Alcohol use: Yes    Alcohol/week: 1.0 standard drinks    Types: 1 Glasses of wine per week    Comment: daily  . Drug use: No    FAMILY HISTORY: Family History  Problem Relation Age of Onset  . Hypertension Unknown   . Ovarian cysts Unknown   . Cancer Mother 74       ovarian cancer  . Heart attack Father 20  . Kidney disease Father   . AAA (abdominal aortic aneurysm) Father   . Hypertension Father   . Sudden death Father   .  Alcoholism Father   . Obesity Father   . Colon cancer Neg Hx   . Esophageal cancer Neg Hx   . Rectal cancer Neg Hx   . Stomach cancer Neg Hx     ROS: Review of Systems  Constitutional: Positive for malaise/fatigue and weight loss.  Gastrointestinal: Negative for nausea and vomiting.  Genitourinary:       Negative for polyuria.  Musculoskeletal:       Negative for muscle weakness.  Endo/Heme/Allergies: Negative for polydipsia.       Positive for polyphagia.    PHYSICAL EXAM: Blood pressure 113/75, pulse (!) 59, temperature 98.2 F (36.8 C),  temperature source Oral, height 5\' 4"  (1.626 m), weight 196 lb (88.9 kg), last menstrual period 05/28/2011, SpO2 99 %. Body mass index is 33.64 kg/m. Physical Exam  Constitutional: She is oriented to person, place, and time. She appears well-developed and well-nourished.  Cardiovascular: Normal rate.  Pulmonary/Chest: Effort normal.  Musculoskeletal: Normal range of motion.  Neurological: She is oriented to person, place, and time.  Skin: Skin is warm and dry.  Psychiatric: She has a normal mood and affect. Her behavior is normal.  Vitals reviewed.   RECENT LABS AND TESTS: BMET    Component Value Date/Time   NA 141 09/18/2018 0957   K 4.3 09/18/2018 0957   CL 101 09/18/2018 0957   CO2 21 09/18/2018 0957   GLUCOSE 83 09/18/2018 0957   GLUCOSE 86 07/07/2018 1545   BUN 11 09/18/2018 0957   CREATININE 0.83 09/18/2018 0957   CREATININE 0.74 12/30/2015 1648   CALCIUM 9.4 09/18/2018 0957   GFRNONAA 80 09/18/2018 0957   GFRAA 92 09/18/2018 0957   Lab Results  Component Value Date   HGBA1C 5.7 (H) 09/18/2018   Lab Results  Component Value Date   INSULIN 4.7 09/18/2018   CBC    Component Value Date/Time   WBC 4.7 09/18/2018 0957   WBC 6.4 07/07/2018 1545   RBC 4.50 09/18/2018 0957   RBC 4.64 07/07/2018 1545   HGB 12.3 09/18/2018 0957   HCT 38.2 09/18/2018 0957   PLT 235.0 07/07/2018 1545   MCV 85 09/18/2018 0957   MCH 27.3 09/18/2018 0957   MCH 27.5 12/30/2015 1648   MCHC 32.2 09/18/2018 0957   MCHC 32.8 07/07/2018 1545   RDW 14.9 09/18/2018 0957   LYMPHSABS 1.9 09/18/2018 0957   MONOABS 0.4 07/07/2018 1545   EOSABS 0.1 09/18/2018 0957   BASOSABS 0.0 09/18/2018 0957   Iron/TIBC/Ferritin/ %Sat No results found for: IRON, TIBC, FERRITIN, IRONPCTSAT Lipid Panel     Component Value Date/Time   CHOL 170 09/18/2018 0957   TRIG 62 09/18/2018 0957   HDL 73 09/18/2018 0957   CHOLHDL 3 07/07/2018 1545   VLDL 19.2 07/07/2018 1545   LDLCALC 85 09/18/2018 0957    Hepatic Function Panel     Component Value Date/Time   PROT 7.2 09/18/2018 0957   ALBUMIN 4.7 09/18/2018 0957   AST 21 09/18/2018 0957   ALT 20 09/18/2018 0957   ALKPHOS 67 09/18/2018 0957   BILITOT 0.3 09/18/2018 0957      Component Value Date/Time   TSH 3.150 09/18/2018 0957   TSH 3.17 07/07/2018 1545   TSH 2.94 05/14/2017 1409   Results for NEERA, TENG (MRN [id number]) as of 10/07/2018 12:24  Ref. Range 09/18/2018 09:57  Vitamin D, 25-Hydroxy Latest Ref Range: 30.0 - 100.0 ng/mL 33.2   ASSESSMENT AND PLAN: Vitamin D deficiency - Plan: Vitamin D, Ergocalciferol, (  DRISDOL) 1.25 MG (50000 UT) CAPS capsule  Prediabetes  At risk for diabetes mellitus  Class 1 obesity with serious comorbidity and body mass index (BMI) of 33.0 to 33.9 in adult, unspecified obesity type  PLAN:  Vitamin D Deficiency Victoria Caldwell was informed that low vitamin D levels contributes to fatigue and are associated with obesity, breast, and colon cancer. She agrees to continue to take her multivitamin and to start to take prescription Vit D @50 ,000 IU every week #4 with no refills and will follow up for routine testing of vitamin D, at least 2-3 times per year. She was informed of the risk of over-replacement of vitamin D and agrees to not increase her dose unless she discusses this with [place] first. Victoria Caldwell agrees to follow up in 2 weeks.  Pre-Diabetes Victoria Caldwell will continue to work on weight loss, exercise, and decreasing simple carbohydrates in her diet to help decrease the risk of diabetes. We discussed metformin including benefits and risks. She was informed that eating too many simple carbohydrates or too many calories at one sitting increases the likelihood of GI side effects. Victoria Caldwell deferred metformin for now and a prescription was not written today. Victoria Caldwell agreed to continue her diet and exercise and to follow up with [place] as directed to monitor her progress.  Diabetes risk counseling Victoria Caldwell was given  extended (30 minutes) diabetes prevention counseling today. She is 52 y.o. female and has risk factors for diabetes including pre-diabetes and obesity. We discussed intensive lifestyle modifications today with an emphasis on weight loss as well as increasing exercise and decreasing simple carbohydrates in her diet.  Obesity Victoria Caldwell is currently in the action stage of change. As such, her goal is to continue with weight loss efforts. She has agreed to follow the Category 2 plan + 100 calories. Victoria Caldwell has been instructed to work up to a goal of 150 minutes of combined cardio and strengthening exercise per week for weight loss and overall health benefits. We discussed the following Behavioral Modification Strategies today: increasing lean protein intake, decreasing simple carbohydrates, work on meal planning and easy cooking plans, no skipping meals, holiday eating strategies, and celebration eating strategies.  Victoria Caldwell has agreed to follow up with our clinic in 2 weeks. She was informed of the importance of frequent follow up visits to maximize her success with intensive lifestyle modifications for her multiple health conditions.   OBESITY BEHAVIORAL INTERVENTION VISIT  Today's visit was # 2   Starting weight: 201 lbs Starting date: 09/18/18 Today's weight : Weight: 196 lb (88.9 kg)  Today's date: 10/06/2018 Total lbs lost to date: 5  ASK: We discussed the diagnosis of obesity with Victoria Caldwell today and Victoria Caldwell agreed to give [place] permission to discuss obesity behavioral modification therapy today.  ASSESS: Victoria Caldwell has the diagnosis of obesity and her BMI today is 33.6. Victoria Caldwell is in the action stage of change.   ADVISE: Victoria Caldwell was educated on the multiple health risks of obesity as well as the benefit of weight loss to improve her health. She was advised of the need for long term treatment and the importance of lifestyle modifications to improve her current health and to decrease her risk of  future health problems.  AGREE: Multiple dietary modification options and treatment options were discussed and Victoria Caldwell agreed to follow the recommendations documented in the above note.  ARRANGE: Victoria Caldwell was educated on the importance of frequent visits to treat obesity as outlined per CMS and USPSTF guidelines and agreed to schedule  her next follow up appointment today.  I, Marcille Blanco, am acting as transcriptionist for Starlyn Skeans, MD  I have reviewed the above documentation for accuracy and completeness, and I agree with the above. -Dennard Nip, MD

## 2018-10-20 ENCOUNTER — Encounter (INDEPENDENT_AMBULATORY_CARE_PROVIDER_SITE_OTHER): Payer: Self-pay | Admitting: Family Medicine

## 2018-10-20 ENCOUNTER — Ambulatory Visit (INDEPENDENT_AMBULATORY_CARE_PROVIDER_SITE_OTHER): Payer: Federal, State, Local not specified - PPO | Admitting: Family Medicine

## 2018-10-20 VITALS — BP 126/80 | HR 59 | Temp 98.1°F | Ht 64.0 in | Wt 194.0 lb

## 2018-10-20 DIAGNOSIS — E669 Obesity, unspecified: Secondary | ICD-10-CM

## 2018-10-20 DIAGNOSIS — E559 Vitamin D deficiency, unspecified: Secondary | ICD-10-CM | POA: Diagnosis not present

## 2018-10-20 DIAGNOSIS — Z6833 Body mass index (BMI) 33.0-33.9, adult: Secondary | ICD-10-CM

## 2018-10-21 NOTE — Progress Notes (Signed)
Office: [phone]  /  Fax: [phone]   HPI:   Chief Complaint: OBESITY Ishani is here to discuss her progress with her obesity treatment plan. She is on the  follow the Category 2 plan and is following her eating plan approximately 95 % of the time. She states she is exercising 0 minutes 0 times per week. Jarelyn continues to do well with weight loss even over the holidays. She notes an increase in temptations but is doing will with meal planning and prepping.  Her weight is 194 lb (88 kg) today and has had a weight loss of 2 pounds over a period of 2 weeks since her last visit. She has lost 7 lbs since starting treatment with [place].  Vitamin D deficiency Keshonna has a diagnosis of vitamin D deficiency. She is currently taking vit D and denies nausea, vomiting or muscle weakness. She is not yet at goal.   ASSESSMENT AND PLAN:  Vitamin D deficiency  Class 1 obesity with serious comorbidity and body mass index (BMI) of 33.0 to 33.9 in adult, unspecified obesity type  PLAN:  Vitamin D Deficiency Tonnya was informed that low vitamin D levels contributes to fatigue and are associated with obesity, breast, and colon cancer. She agrees to continue to take prescription Vit D @50 ,000 IU every week and will follow up for routine testing of vitamin D, at least 2-3 times per year. She was informed of the risk of over-replacement of vitamin D and agrees to not increase her dose unless she discusses this with [place] first.  I spent > than 50% of the 15 minute visit on counseling as documented in the note.  Obesity Breeona is currently in the action stage of change. As such, her goal is to continue with weight loss efforts She has agreed to follow the Category 2 plan Bethany has been instructed to work up to a goal of 150 minutes of combined cardio and strengthening exercise per week for weight loss and overall health benefits. We discussed the following Behavioral Modification Stratagies today: increasing  lean protein intake, decreasing simple carbohydrates, holiday eating strategies, travel eating strategies and celebration eating strategies.     Taija has agreed to follow up with our clinic in 3 weeks. She was informed of the importance of frequent follow up visits to maximize her success with intensive lifestyle modifications for her multiple health conditions.  ALLERGIES: No Known Allergies  MEDICATIONS: Current Outpatient Medications on File Prior to Visit  Medication Sig Dispense Refill  . cetirizine (ZYRTEC) 10 MG tablet Take 10 mg by mouth daily.    . famotidine (PEPCID) 40 MG tablet Take 1 tablet (40 mg total) by mouth daily. 90 tablet 3  . Multiple Vitamins-Minerals (MULTIVITAMIN ADULT) CHEW Chew 2 each by mouth daily.    . Vitamin D, Ergocalciferol, (DRISDOL) 1.25 MG (50000 UT) CAPS capsule Take 1 capsule (50,000 Units total) by mouth every 7 (seven) days. 4 capsule 0   No current facility-administered medications on file prior to visit.     PAST MEDICAL HISTORY: Past Medical History:  Diagnosis Date  . Back pain   . Chickenpox   . GERD (gastroesophageal reflux disease)   . Joint pain   . OSA (obstructive sleep apnea) 09/10/2018  . Sinus problem     PAST SURGICAL HISTORY: Past Surgical History:  Procedure Laterality Date  . BACK SURGERY    . LAPAROSCOPIC SUPRACERVICAL HYSTERECTOMY  06/26/2011   Procedure: LAPAROSCOPIC SUPRACERVICAL HYSTERECTOMY;  Surgeon: Logan Bores;  Location: [facility];  Service: Gynecology;  Laterality: N/A;  . SALPINGOOPHORECTOMY  06/26/2011   Procedure: SALPINGO OOPHERECTOMY;  Surgeon: Logan Bores;  Location: [facility];  Service: Gynecology;  Laterality: Bilateral;    SOCIAL HISTORY: Social History   Tobacco Use  . Smoking status: Never Smoker  . Smokeless tobacco: Never Used  Substance Use Topics  . Alcohol use: Yes    Alcohol/week: 1.0 standard drinks    Types: 1 Glasses of wine per week    Comment: daily  . Drug use: No     FAMILY HISTORY: Family History  Problem Relation Age of Onset  . Hypertension Unknown   . Ovarian cysts Unknown   . Cancer Mother 62       ovarian cancer  . Heart attack Father 32  . Kidney disease Father   . AAA (abdominal aortic aneurysm) Father   . Hypertension Father   . Sudden death Father   . Alcoholism Father   . Obesity Father   . Colon cancer Neg Hx   . Esophageal cancer Neg Hx   . Rectal cancer Neg Hx   . Stomach cancer Neg Hx     ROS: Review of Systems  Constitutional: Positive for weight loss.  All other systems reviewed and are negative.   PHYSICAL EXAM: Blood pressure 126/80, pulse (!) 59, temperature 98.1 F (36.7 C), temperature source Oral, height 5\' 4"  (1.626 m), weight 194 lb (88 kg), last menstrual period 05/28/2011, SpO2 99 %. Body mass index is 33.3 kg/m. Physical Exam Vitals signs reviewed.  HENT:     Head: Normocephalic.  Eyes:     Extraocular Movements: Extraocular movements intact.  Neck:     Musculoskeletal: Normal range of motion.  Pulmonary:     Effort: Pulmonary effort is normal.  Musculoskeletal: Normal range of motion.  Skin:    General: Skin is warm.  Neurological:     Mental Status: She is alert and oriented to person, place, and time.  Psychiatric:        Behavior: Behavior normal.     RECENT LABS AND TESTS: BMET    Component Value Date/Time   NA 141 09/18/2018 0957   K 4.3 09/18/2018 0957   CL 101 09/18/2018 0957   CO2 21 09/18/2018 0957   GLUCOSE 83 09/18/2018 0957   GLUCOSE 86 07/07/2018 1545   BUN 11 09/18/2018 0957   CREATININE 0.83 09/18/2018 0957   CREATININE 0.74 12/30/2015 1648   CALCIUM 9.4 09/18/2018 0957   GFRNONAA 80 09/18/2018 0957   GFRAA 92 09/18/2018 0957   Lab Results  Component Value Date   HGBA1C 5.7 (H) 09/18/2018   Lab Results  Component Value Date   INSULIN 4.7 09/18/2018   CBC    Component Value Date/Time   WBC 4.7 09/18/2018 0957   WBC 6.4 07/07/2018 1545   RBC 4.50  09/18/2018 0957   RBC 4.64 07/07/2018 1545   HGB 12.3 09/18/2018 0957   HCT 38.2 09/18/2018 0957   PLT 235.0 07/07/2018 1545   MCV 85 09/18/2018 0957   MCH 27.3 09/18/2018 0957   MCH 27.5 12/30/2015 1648   MCHC 32.2 09/18/2018 0957   MCHC 32.8 07/07/2018 1545   RDW 14.9 09/18/2018 0957   LYMPHSABS 1.9 09/18/2018 0957   MONOABS 0.4 07/07/2018 1545   EOSABS 0.1 09/18/2018 0957   BASOSABS 0.0 09/18/2018 0957   Iron/TIBC/Ferritin/ %Sat No results found for: IRON, TIBC, FERRITIN, IRONPCTSAT Lipid Panel     Component Value Date/Time  CHOL 170 09/18/2018 0957   TRIG 62 09/18/2018 0957   HDL 73 09/18/2018 0957   CHOLHDL 3 07/07/2018 1545   VLDL 19.2 07/07/2018 1545   LDLCALC 85 09/18/2018 0957   Hepatic Function Panel     Component Value Date/Time   PROT 7.2 09/18/2018 0957   ALBUMIN 4.7 09/18/2018 0957   AST 21 09/18/2018 0957   ALT 20 09/18/2018 0957   ALKPHOS 67 09/18/2018 0957   BILITOT 0.3 09/18/2018 0957      Component Value Date/Time   TSH 3.150 09/18/2018 0957   TSH 3.17 07/07/2018 1545   TSH 2.94 05/14/2017 1409      OBESITY BEHAVIORAL INTERVENTION VISIT  Today's visit was # 3   Starting weight: 201 lbs Starting date: 09/18/18 Today's weight : Weight: 194 lb (88 kg)  Today's date: 10/20/18 Total lbs lost to date: 7   ASK: We discussed the diagnosis of obesity with Jamal Collin today and Jeanann agreed to give [place] permission to discuss obesity behavioral modification therapy today.  ASSESS: Ricardo has the diagnosis of obesity and her BMI today is 33.4 Coren is in the action stage of change   ADVISE: Giulietta was educated on the multiple health risks of obesity as well as the benefit of weight loss to improve her health. She was advised of the need for long term treatment and the importance of lifestyle modifications to improve her current health and to decrease her risk of future health problems.  AGREE: Multiple dietary modification options and  treatment options were discussed and  Katana agreed to follow the recommendations documented in the above note.  ARRANGE: Lilu was educated on the importance of frequent visits to treat obesity as outlined per CMS and USPSTF guidelines and agreed to schedule her next follow up appointment today.  I, April Moore, am acting as Location manager for Dennard Nip, MD.  I have reviewed the above documentation for accuracy and completeness, and I agree with the above. -Dennard Nip, MD

## 2018-10-28 DIAGNOSIS — G4733 Obstructive sleep apnea (adult) (pediatric): Secondary | ICD-10-CM | POA: Diagnosis not present

## 2018-11-11 ENCOUNTER — Encounter (INDEPENDENT_AMBULATORY_CARE_PROVIDER_SITE_OTHER): Payer: Self-pay | Admitting: Physician Assistant

## 2018-11-11 ENCOUNTER — Ambulatory Visit (INDEPENDENT_AMBULATORY_CARE_PROVIDER_SITE_OTHER): Payer: Federal, State, Local not specified - PPO | Admitting: Physician Assistant

## 2018-11-11 VITALS — BP 119/78 | HR 60 | Temp 98.6°F | Ht 64.0 in | Wt 193.0 lb

## 2018-11-11 DIAGNOSIS — E559 Vitamin D deficiency, unspecified: Secondary | ICD-10-CM

## 2018-11-11 DIAGNOSIS — Z6833 Body mass index (BMI) 33.0-33.9, adult: Secondary | ICD-10-CM

## 2018-11-11 DIAGNOSIS — Z9189 Other specified personal risk factors, not elsewhere classified: Secondary | ICD-10-CM | POA: Diagnosis not present

## 2018-11-11 DIAGNOSIS — R7303 Prediabetes: Secondary | ICD-10-CM

## 2018-11-11 DIAGNOSIS — E669 Obesity, unspecified: Secondary | ICD-10-CM

## 2018-11-11 MED ORDER — VITAMIN D (ERGOCALCIFEROL) 1.25 MG (50000 UNIT) PO CAPS: 50000.0000 [IU] | ORAL_CAPSULE | ORAL | 0 refills | Status: DC

## 2018-11-12 NOTE — Progress Notes (Signed)
Office: [phone]  /  Fax: [phone]   HPI:   Chief Complaint: OBESITY Willie is here to discuss her progress with her obesity treatment plan. She is on the Category 2 plan and is following her eating plan approximately 95 % of the time. She states she is exercising 0 minutes 0 times per week. Khiya has done well with weight loss. She reports being bored with dinner. She would like some additional options. Her weight is 193 lb (87.5 kg) today and has had a weight loss of 1 pounds over a period of 3 weeks since her last visit. She has lost 12 lbs since starting treatment with [place].  Vitamin D deficiency Breleigh has a diagnosis of vitamin D deficiency. She is currently taking prescription Vit D and denies nausea, vomiting or muscle weakness.  Pre-Diabetes Pierce has a diagnosis of prediabetes based on her elevated Hgb A1c and was informed this puts her at greater risk of developing diabetes. She is not taking metformin currently and continues to work on diet and exercise to decrease risk of diabetes. She denies nausea or hypoglycemia. She denies polyphagia.  At risk for osteopenia and osteoporosis Manessa is at higher risk of osteopenia and osteoporosis due to vitamin D deficiency.    ASSESSMENT AND PLAN:  Vitamin D deficiency - Plan: Vitamin D, Ergocalciferol, (DRISDOL) 1.25 MG (50000 UT) CAPS capsule  Prediabetes  At risk for osteoporosis  Class 1 obesity with serious comorbidity and body mass index (BMI) of 33.0 to 33.9 in adult, unspecified obesity type  PLAN:  Vitamin D Deficiency Indica was informed that low vitamin D levels contributes to fatigue and are associated with obesity, breast, and colon cancer. She agrees to continue to take prescription Vit D @50 ,000 IU every week # 4 with no refills and will follow up for routine testing of vitamin D, at least 2-3 times per year. She was informed of the risk of over-replacement of vitamin D and agrees to not increase her dose unless  she discusses this with [place] first. Keltie agrees to follow up with our clinic in 2 weeks.  Pre-Diabetes Maja will continue to work on weight loss, exercise, and decreasing simple carbohydrates in her diet to help decrease the risk of diabetes.  She was informed that eating too many simple carbohydrates or too many calories at one sitting increases the likelihood of GI side effects. She is not on any diabetic medications. Merritt agrees to follow up with our clinic in 2 weeks.  At risk for osteopenia and osteoporosis Zina was given extended  (15 minutes) osteoporosis prevention counseling today. Jamine is at risk for osteopenia and osteoporosis due to her vitamin D deficiency. She was encouraged to take her vitamin D and follow her higher calcium diet and increase strengthening exercise to help strengthen her bones and decrease her risk of osteopenia and osteoporosis.  Obesity Dejanee is currently in the action stage of change. As such, her goal is to continue with weight loss efforts She has agreed to follow the Category 2 plan journaling 400-500 calories and 35 grams of protein with supper. Fayth has been instructed to work up to a goal of 150 minutes of combined cardio and strengthening exercise per week for weight loss and overall health benefits. We discussed the following Behavioral Modification Strategies today: work on meal planning and easy cooking plans and ways to avoid boredom eating  Sydna has agreed to follow up with our clinic in 2 weeks. She was informed of the  importance of frequent follow up visits to maximize her success with intensive lifestyle modifications for her multiple health conditions.  ALLERGIES: No Known Allergies  MEDICATIONS: Current Outpatient Medications on File Prior to Visit  Medication Sig Dispense Refill  . cetirizine (ZYRTEC) 10 MG tablet Take 10 mg by mouth daily.    . famotidine (PEPCID) 40 MG tablet Take 1 tablet (40 mg total) by mouth daily. 90 tablet 3    . Multiple Vitamins-Minerals (MULTIVITAMIN ADULT) CHEW Chew 2 each by mouth daily.     No current facility-administered medications on file prior to visit.     PAST MEDICAL HISTORY: Past Medical History:  Diagnosis Date  . Back pain   . Chickenpox   . GERD (gastroesophageal reflux disease)   . Joint pain   . OSA (obstructive sleep apnea) 09/10/2018  . Sinus problem     PAST SURGICAL HISTORY: Past Surgical History:  Procedure Laterality Date  . BACK SURGERY    . LAPAROSCOPIC SUPRACERVICAL HYSTERECTOMY  06/26/2011   Procedure: LAPAROSCOPIC SUPRACERVICAL HYSTERECTOMY;  Surgeon: Logan Bores;  Location: [facility];  Service: Gynecology;  Laterality: N/A;  . SALPINGOOPHORECTOMY  06/26/2011   Procedure: SALPINGO OOPHERECTOMY;  Surgeon: Logan Bores;  Location: [facility];  Service: Gynecology;  Laterality: Bilateral;    SOCIAL HISTORY: Social History   Tobacco Use  . Smoking status: Never Smoker  . Smokeless tobacco: Never Used  Substance Use Topics  . Alcohol use: Yes    Alcohol/week: 1.0 standard drinks    Types: 1 Glasses of wine per week    Comment: daily  . Drug use: No    FAMILY HISTORY: Family History  Problem Relation Age of Onset  . Hypertension Unknown   . Ovarian cysts Unknown   . Cancer Mother 5       ovarian cancer  . Heart attack Father 55  . Kidney disease Father   . AAA (abdominal aortic aneurysm) Father   . Hypertension Father   . Sudden death Father   . Alcoholism Father   . Obesity Father   . Colon cancer Neg Hx   . Esophageal cancer Neg Hx   . Rectal cancer Neg Hx   . Stomach cancer Neg Hx     ROS: Review of Systems  Constitutional: Positive for weight loss.  Gastrointestinal: Negative for nausea and vomiting.  Genitourinary: Negative for frequency.  Musculoskeletal:       Negative for muscle weakness  Endo/Heme/Allergies: Negative for polydipsia.       Negative for hypoglycemia Negative for polyphagia    PHYSICAL EXAM: Blood  pressure 119/78, pulse 60, temperature 98.6 F (37 C), temperature source Oral, height 5\' 4"  (1.626 m), weight 193 lb (87.5 kg), last menstrual period 05/28/2011, SpO2 100 %. Body mass index is 33.13 kg/m. Physical Exam Vitals signs reviewed.  Constitutional:      Appearance: Normal appearance. She is obese.  Cardiovascular:     Rate and Rhythm: Normal rate.     Pulses: Normal pulses.  Pulmonary:     Effort: Pulmonary effort is normal.  Musculoskeletal: Normal range of motion.  Skin:    General: Skin is warm and dry.  Neurological:     Mental Status: She is alert and oriented to person, place, and time.  Psychiatric:        Mood and Affect: Mood normal.        Behavior: Behavior normal.     RECENT LABS AND TESTS: BMET    Component Value  Date/Time   NA 141 09/18/2018 0957   K 4.3 09/18/2018 0957   CL 101 09/18/2018 0957   CO2 21 09/18/2018 0957   GLUCOSE 83 09/18/2018 0957   GLUCOSE 86 07/07/2018 1545   BUN 11 09/18/2018 0957   CREATININE 0.83 09/18/2018 0957   CREATININE 0.74 12/30/2015 1648   CALCIUM 9.4 09/18/2018 0957   GFRNONAA 80 09/18/2018 0957   GFRAA 92 09/18/2018 0957   Lab Results  Component Value Date   HGBA1C 5.7 (H) 09/18/2018   Lab Results  Component Value Date   INSULIN 4.7 09/18/2018   CBC    Component Value Date/Time   WBC 4.7 09/18/2018 0957   WBC 6.4 07/07/2018 1545   RBC 4.50 09/18/2018 0957   RBC 4.64 07/07/2018 1545   HGB 12.3 09/18/2018 0957   HCT 38.2 09/18/2018 0957   PLT 235.0 07/07/2018 1545   MCV 85 09/18/2018 0957   MCH 27.3 09/18/2018 0957   MCH 27.5 12/30/2015 1648   MCHC 32.2 09/18/2018 0957   MCHC 32.8 07/07/2018 1545   RDW 14.9 09/18/2018 0957   LYMPHSABS 1.9 09/18/2018 0957   MONOABS 0.4 07/07/2018 1545   EOSABS 0.1 09/18/2018 0957   BASOSABS 0.0 09/18/2018 0957   Iron/TIBC/Ferritin/ %Sat No results found for: IRON, TIBC, FERRITIN, IRONPCTSAT Lipid Panel     Component Value Date/Time   CHOL 170 09/18/2018  0957   TRIG 62 09/18/2018 0957   HDL 73 09/18/2018 0957   CHOLHDL 3 07/07/2018 1545   VLDL 19.2 07/07/2018 1545   LDLCALC 85 09/18/2018 0957   Hepatic Function Panel     Component Value Date/Time   PROT 7.2 09/18/2018 0957   ALBUMIN 4.7 09/18/2018 0957   AST 21 09/18/2018 0957   ALT 20 09/18/2018 0957   ALKPHOS 67 09/18/2018 0957   BILITOT 0.3 09/18/2018 0957      Component Value Date/Time   TSH 3.150 09/18/2018 0957   TSH 3.17 07/07/2018 1545   TSH 2.94 05/14/2017 1409     Ref. Range 09/18/2018 09:57  Vitamin D, 25-Hydroxy Latest Ref Range: 30.0 - 100.0 ng/mL 33.2     OBESITY BEHAVIORAL INTERVENTION VISIT  Today's visit was # 4   Starting weight: 205 lbs Starting date: 09/18/2018 Today's weight :: 193 lb  Today's date: 11/11/2018 Total lbs lost to date: 12  ASK: We discussed the diagnosis of obesity with Jamal Collin today and Jacquline agreed to give [place] permission to discuss obesity behavioral modification therapy today.  ASSESS: Vina has the diagnosis of obesity and her BMI today is 33.11 Quintessa is in the action stage of change   ADVISE: Maurita was educated on the multiple health risks of obesity as well as the benefit of weight loss to improve her health. She was advised of the need for long term treatment and the importance of lifestyle modifications to improve her current health and to decrease her risk of future health problems.  AGREE: Multiple dietary modification options and treatment options were discussed and  Nicolle agreed to follow the recommendations documented in the above note.  ARRANGE: Telisa was educated on the importance of frequent visits to treat obesity as outlined per CMS and USPSTF guidelines and agreed to schedule her next follow up appointment today.  I, Tammy Wysor, am acting as Location manager for [employer], PA-C I, Abby Potash, PA-C have reviewed above note and agree with its content

## 2018-11-27 ENCOUNTER — Encounter (INDEPENDENT_AMBULATORY_CARE_PROVIDER_SITE_OTHER): Payer: Self-pay | Admitting: Physician Assistant

## 2018-11-27 ENCOUNTER — Ambulatory Visit (INDEPENDENT_AMBULATORY_CARE_PROVIDER_SITE_OTHER): Payer: Federal, State, Local not specified - PPO | Admitting: Physician Assistant

## 2018-11-27 VITALS — BP 120/79 | HR 63 | Temp 98.3°F | Ht 64.0 in | Wt 189.0 lb

## 2018-11-27 DIAGNOSIS — R7303 Prediabetes: Secondary | ICD-10-CM | POA: Diagnosis not present

## 2018-11-27 DIAGNOSIS — E559 Vitamin D deficiency, unspecified: Secondary | ICD-10-CM | POA: Diagnosis not present

## 2018-11-27 DIAGNOSIS — Z9189 Other specified personal risk factors, not elsewhere classified: Secondary | ICD-10-CM | POA: Diagnosis not present

## 2018-11-27 DIAGNOSIS — E66811 Obesity, class 1: Secondary | ICD-10-CM

## 2018-11-27 DIAGNOSIS — E669 Obesity, unspecified: Secondary | ICD-10-CM | POA: Diagnosis not present

## 2018-11-27 DIAGNOSIS — Z6832 Body mass index (BMI) 32.0-32.9, adult: Secondary | ICD-10-CM

## 2018-11-27 MED ORDER — VITAMIN D (ERGOCALCIFEROL) 1.25 MG (50000 UNIT) PO CAPS: 50000.0000 [IU] | ORAL_CAPSULE | ORAL | 0 refills | Status: DC

## 2018-12-01 NOTE — Progress Notes (Signed)
Office: [phone]  /  Fax: [phone]   HPI:   Chief Complaint: OBESITY Victoria Caldwell is here to discuss her progress with her obesity treatment plan. She is on the  keep a food journal with 400-500 calories and 35 grams of protein at supper and follow the Category 2 plan and is following her eating plan approximately 85-90 % of the time. She states she is exercising 0 minutes 0 times per week. Victoria Caldwell did well with weight loss. She reports that there are nights that she is skipping dinner due to being too tired to cook after work.  Her weight is 189 lb (85.7 kg) today and has had a weight loss of 4 pounds over a period of 2 weeks since her last visit. She has lost 16 lbs since starting treatment with [place].   Vitamin D deficiency Victoria Caldwell has a diagnosis of vitamin D deficiency. She is currently taking prescription Vit D and denies nausea, vomiting or muscle weakness.  Pre-Diabetes Victoria Caldwell has a diagnosis of prediabetes based on her elevated Hgb A1c and was informed this puts her at greater risk of developing diabetes. She is not taking metformin currently and continues to work on diet and exercise to decrease risk of diabetes. She denies nausea or hypoglycemia.  At risk for osteopenia and osteoporosis Victoria Caldwell is at higher risk of osteopenia and osteoporosis due to vitamin D deficiency.    ASSESSMENT AND PLAN:  Vitamin D deficiency - Plan: Vitamin D, Ergocalciferol, (DRISDOL) 1.25 MG (50000 UT) CAPS capsule  Prediabetes  At risk for osteoporosis  Class 1 obesity with serious comorbidity and body mass index (BMI) of 32.0 to 32.9 in adult, unspecified obesity type  PLAN:  Vitamin D Deficiency Ski was informed that low vitamin D levels contributes to fatigue and are associated with obesity, breast, and colon cancer. She agrees to continue to take prescription Vit D @50 ,000 IU every week #4 with no refills and will follow up for routine testing of vitamin D, at least 2-3 times per year. She was  informed of the risk of over-replacement of vitamin D and agrees to not increase her dose unless she discusses this with [place] first. Eda agrees to follow up with our clinic in 3 weeks.  Pre-Diabetes Victoria Caldwell will continue to work on weight loss, exercise, and decreasing simple carbohydrates in her diet to help decrease the risk of diabetes. She was informed that eating too many simple carbohydrates or too many calories at one sitting increases the likelihood of GI side effects. She is not on any medication. She denies polyphagia. Victoria Caldwell agrees to follow up with our clinic in 3 weeks.  At risk for osteopenia and osteoporosis [name] was given extended  (15 minutes) osteoporosis prevention counseling today. Victoria Caldwell is at risk for osteopenia and osteoporsis due to her vitamin D deficiency. She was encouraged to take her vitamin D and follow her higher calcium diet and increase strengthening exercise to help strengthen her bones and decrease her risk of osteopenia and osteoporosis.  Obesity Victoria Caldwell is currently in the action stage of change. As such, her goal is to continue with weight loss efforts She has agreed to keep a food journal with 400-500 calories and 35 grams of  Protein at supper and follow the Category 2 plan Victoria Caldwell has been instructed to work up to a goal of 150 minutes of combined cardio and strengthening exercise per week for weight loss and overall health benefits. We discussed the following Behavioral Modification Stratagies today: decreasing simple  carbohydrates and keeping healthy foods in the home   Victoria Caldwell has agreed to follow up with our clinic in 3 weeks. She was informed of the importance of frequent follow up visits to maximize her success with intensive lifestyle modifications for her multiple health conditions.  ALLERGIES: No Known Allergies  MEDICATIONS: Current Outpatient Medications on File Prior to Visit  Medication Sig Dispense Refill  . cetirizine (ZYRTEC) 10 MG tablet Take  10 mg by mouth daily.    . famotidine (PEPCID) 40 MG tablet Take 1 tablet (40 mg total) by mouth daily. 90 tablet 3  . Multiple Vitamins-Minerals (MULTIVITAMIN ADULT) CHEW Chew 2 each by mouth daily.     No current facility-administered medications on file prior to visit.     PAST MEDICAL HISTORY: Past Medical History:  Diagnosis Date  . Back pain   . Chickenpox   . GERD (gastroesophageal reflux disease)   . Joint pain   . OSA (obstructive sleep apnea) 09/10/2018  . Sinus problem     PAST SURGICAL HISTORY: Past Surgical History:  Procedure Laterality Date  . BACK SURGERY    . LAPAROSCOPIC SUPRACERVICAL HYSTERECTOMY  06/26/2011   Procedure: LAPAROSCOPIC SUPRACERVICAL HYSTERECTOMY;  Surgeon: Logan Bores;  Location: [facility];  Service: Gynecology;  Laterality: N/A;  . SALPINGOOPHORECTOMY  06/26/2011   Procedure: SALPINGO OOPHERECTOMY;  Surgeon: Logan Bores;  Location: [facility];  Service: Gynecology;  Laterality: Bilateral;    SOCIAL HISTORY: Social History   Tobacco Use  . Smoking status: Never Smoker  . Smokeless tobacco: Never Used  Substance Use Topics  . Alcohol use: Yes    Alcohol/week: 1.0 standard drinks    Types: 1 Glasses of wine per week    Comment: daily  . Drug use: No    FAMILY HISTORY: Family History  Problem Relation Age of Onset  . Hypertension Unknown   . Ovarian cysts Unknown   . Cancer Mother 30       ovarian cancer  . Heart attack Father 13  . Kidney disease Father   . AAA (abdominal aortic aneurysm) Father   . Hypertension Father   . Sudden death Father   . Alcoholism Father   . Obesity Father   . Colon cancer Neg Hx   . Esophageal cancer Neg Hx   . Rectal cancer Neg Hx   . Stomach cancer Neg Hx     ROS: Review of Systems  Constitutional: Positive for weight loss.  Gastrointestinal: Negative for nausea and vomiting.  Genitourinary:       Negative for polyuria  Musculoskeletal:       Negative for muscle weakness    Endo/Heme/Allergies:       Negative for hypoglycemia Negative for polyphagia    PHYSICAL EXAM: Blood pressure 120/79, pulse 63, temperature 98.3 F (36.8 C), temperature source Oral, height 5\' 4"  (1.626 m), weight 189 lb (85.7 kg), last menstrual period 05/28/2011, SpO2 100 %. Body mass index is 32.44 kg/m. Physical Exam Vitals signs reviewed.  Constitutional:      Appearance: Normal appearance. She is obese.  Cardiovascular:     Rate and Rhythm: Normal rate.     Pulses: Normal pulses.  Pulmonary:     Effort: Pulmonary effort is normal.  Musculoskeletal: Normal range of motion.  Skin:    General: Skin is warm and dry.  Neurological:     Mental Status: She is alert and oriented to person, place, and time.  Psychiatric:  Mood and Affect: Mood normal.        Behavior: Behavior normal.     RECENT LABS AND TESTS: BMET    Component Value Date/Time   NA 141 09/18/2018 0957   K 4.3 09/18/2018 0957   CL 101 09/18/2018 0957   CO2 21 09/18/2018 0957   GLUCOSE 83 09/18/2018 0957   GLUCOSE 86 07/07/2018 1545   BUN 11 09/18/2018 0957   CREATININE 0.83 09/18/2018 0957   CREATININE 0.74 12/30/2015 1648   CALCIUM 9.4 09/18/2018 0957   GFRNONAA 80 09/18/2018 0957   GFRAA 92 09/18/2018 0957   Lab Results  Component Value Date   HGBA1C 5.7 (H) 09/18/2018   Lab Results  Component Value Date   INSULIN 4.7 09/18/2018   CBC    Component Value Date/Time   WBC 4.7 09/18/2018 0957   WBC 6.4 07/07/2018 1545   RBC 4.50 09/18/2018 0957   RBC 4.64 07/07/2018 1545   HGB 12.3 09/18/2018 0957   HCT 38.2 09/18/2018 0957   PLT 235.0 07/07/2018 1545   MCV 85 09/18/2018 0957   MCH 27.3 09/18/2018 0957   MCH 27.5 12/30/2015 1648   MCHC 32.2 09/18/2018 0957   MCHC 32.8 07/07/2018 1545   RDW 14.9 09/18/2018 0957   LYMPHSABS 1.9 09/18/2018 0957   MONOABS 0.4 07/07/2018 1545   EOSABS 0.1 09/18/2018 0957   BASOSABS 0.0 09/18/2018 0957   Iron/TIBC/Ferritin/ %Sat No results  found for: IRON, TIBC, FERRITIN, IRONPCTSAT Lipid Panel     Component Value Date/Time   CHOL 170 09/18/2018 0957   TRIG 62 09/18/2018 0957   HDL 73 09/18/2018 0957   CHOLHDL 3 07/07/2018 1545   VLDL 19.2 07/07/2018 1545   LDLCALC 85 09/18/2018 0957   Hepatic Function Panel     Component Value Date/Time   PROT 7.2 09/18/2018 0957   ALBUMIN 4.7 09/18/2018 0957   AST 21 09/18/2018 0957   ALT 20 09/18/2018 0957   ALKPHOS 67 09/18/2018 0957   BILITOT 0.3 09/18/2018 0957      Component Value Date/Time   TSH 3.150 09/18/2018 0957   TSH 3.17 07/07/2018 1545   TSH 2.94 05/14/2017 1409     Ref. Range 09/18/2018 09:57  Vitamin D, 25-Hydroxy Latest Ref Range: 30.0 - 100.0 ng/mL 33.2     OBESITY BEHAVIORAL INTERVENTION VISIT  Today's visit was # 5   Starting weight: 205 lbs Starting date: 09/18/2018 Today's weight : Weight: 189 lbs Today's date: 11/27/2018 Total lbs lost to date: 16  ASK: We discussed the diagnosis of obesity with Jamal Collin today and Ricarda agreed to give [place] permission to discuss obesity behavioral modification therapy today.  ASSESS: Enedina has the diagnosis of obesity and her BMI today is 32.43 Ameia is in the action stage of change   ADVISE: Aisia was educated on the multiple health risks of obesity as well as the benefit of weight loss to improve her health. She was advised of the need for long term treatment and the importance of lifestyle modifications to improve her current health and to decrease her risk of future health problems.  AGREE: Multiple dietary modification options and treatment options were discussed and  Boni agreed to follow the recommendations documented in the above note.  ARRANGE: Rain was educated on the importance of frequent visits to treat obesity as outlined per CMS and USPSTF guidelines and agreed to schedule her next follow up appointment today.  I, Tammy Wysor, am acting as Location manager for [employer]  PA-C    I, Abby Potash, PA-C have reviewed above note and agree with its content

## 2018-12-07 DIAGNOSIS — L0100 Impetigo, unspecified: Secondary | ICD-10-CM | POA: Diagnosis not present

## 2018-12-08 ENCOUNTER — Telehealth: Payer: Self-pay | Admitting: Family Medicine

## 2018-12-08 NOTE — Telephone Encounter (Signed)
Pt reports went to [facility] yesterday for "Rash" on neck. States she was told it was a bacterial infection and prescribed an antibiotic. Calling today stating she "Looked up shingles and the rash looks just like that." States itching and painful "patches", area on left neck. 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 Larose Kells for tomorrow. Pt questioning if she should leave work until seen by Dr. Larose Kells. Advised to do so if unable to keep covered and areas are open. Pt verbalizes understanding. Advised to CB if symptoms worsen.

## 2018-12-09 ENCOUNTER — Ambulatory Visit: Payer: Federal, State, Local not specified - PPO | Admitting: Internal Medicine

## 2018-12-09 ENCOUNTER — Encounter: Payer: Self-pay | Admitting: Internal Medicine

## 2018-12-09 VITALS — BP 116/72 | HR 72 | Temp 97.7°F | Resp 18 | Ht 64.0 in | Wt 193.2 lb

## 2018-12-09 DIAGNOSIS — B029 Zoster without complications: Secondary | ICD-10-CM

## 2018-12-09 MED ORDER — VALACYCLOVIR HCL 1 G PO TABS: 1000.0000 mg | ORAL_TABLET | Freq: Three times a day (TID) | ORAL | 0 refills | Status: DC

## 2018-12-09 NOTE — Patient Instructions (Signed)
Take valtrex  Call if no better    Shingles  Shingles, which is also known as herpes zoster, is an infection that causes a painful skin rash and fluid-filled blisters. It is caused by a virus. Shingles only develops in people who:  Have had chickenpox.  Have been given a medicine to protect against chickenpox (have been vaccinated). Shingles is rare in this group. What are the causes? Shingles is caused by varicella-zoster virus (VZV). This is the same virus that causes chickenpox. After a person is exposed to VZV, the virus stays in the body in an inactive (dormant) state. Shingles develops if the virus is reactivated. This can happen many years after the first (initial) exposure to VZV. It is not known what causes this virus to be reactivated. What increases the risk? People who have had chickenpox or received the chickenpox vaccine are at risk for shingles. Shingles infection is more common in people who:  Are older than age 75.  Have a weakened disease-fighting system (immune system), such as people with: ? HIV. ? AIDS. ? Cancer.  Are taking medicines that weaken the immune system, such as transplant medicines.  Are experiencing a lot of stress. What are the signs or symptoms? Early symptoms of this condition include itching, tingling, and pain in an area on your skin. Pain may be described as burning, stabbing, or throbbing. A few days or weeks after early symptoms start, a painful red rash appears. The rash is usually on one side of the body and has a band-like or belt-like pattern. The rash eventually turns into fluid-filled blisters that break open, change into scabs, and dry up in about 2-3 weeks. At any time during the infection, you may also develop:  A fever.  Chills.  A headache.  An upset stomach. How is this diagnosed? This condition is diagnosed with a skin exam. Skin or fluid samples may be taken from the blisters before a diagnosis is made. These samples are  examined under a microscope or sent to a lab for testing. How is this treated? The rash may last for several weeks. There is not a specific cure for this condition. Your health care provider will probably prescribe medicines to help you manage pain, recover more quickly, and avoid long-term problems. Medicines may include:  Antiviral drugs.  Anti-inflammatory drugs.  Pain medicines.  Anti-itching medicines (antihistamines). If the area involved is on your face, you may be referred to a specialist, such as an eye doctor (ophthalmologist) or an ear, nose, and throat (ENT) doctor (otolaryngologist) to help you avoid eye problems, chronic pain, or disability. Follow these instructions at home: Medicines  Take over-the-counter and prescription medicines only as told by your health care provider.  Apply an anti-itch cream or numbing cream to the affected area as told by your health care provider. Relieving itching and discomfort   Apply cold, wet cloths (cold compresses) to the area of the rash or blisters as told by your health care provider.  Cool baths can be soothing. Try adding baking soda or dry oatmeal to the water to reduce itching. Do not bathe in hot water. Blister and rash care  Keep your rash covered with a loose bandage (dressing). Wear loose-fitting clothing to help ease the pain of material rubbing against the rash.  Keep your rash and blisters clean by washing the area with mild soap and cool water as told by your health care provider.  Check your rash every day for signs of infection.  Check for: ? More redness, swelling, or pain. ? Fluid or blood. ? Warmth. ? Pus or a bad smell.  Do not scratch your rash or pick at your blisters. To help avoid scratching: ? Keep your fingernails clean and cut short. ? Wear gloves or mittens while you sleep, if scratching is a problem. General instructions  Rest as told by your health care provider.  Keep all follow-up visits as  told by your health care provider. This is important.  Wash your hands often with soap and water. If soap and water are not available, use hand sanitizer. Doing this lowers your chance of getting a bacterial skin infection.  Before your blisters change into scabs, your shingles infection can cause chickenpox in people who have never had it or have never been vaccinated against it. To prevent this from happening, avoid contact with other people, especially: ? Babies. ? Pregnant women. ? Children who have eczema. ? Elderly people who have transplants. ? People who have chronic illnesses, such as cancer or AIDS. Contact a health care provider if:  Your pain is not relieved with prescribed medicines.  Your pain does not get better after the rash heals.  You have signs of infection in the rash area, such as: ? More redness, swelling, or pain around the rash. ? Fluid or blood coming from the rash. ? The rash area feeling warm to the touch. ? Pus or a bad smell coming from the rash. Get help right away if:  The rash is on your face or nose.  You have facial pain, pain around your eye area, or loss of feeling on one side of your face.  You have difficulty seeing.  You have ear pain or have ringing in your ear.  You have a loss of taste.  Your condition gets worse. Summary  Shingles, which is also known as herpes zoster, is an infection that causes a painful skin rash and fluid-filled blisters.  This condition is diagnosed with a skin exam. Skin or fluid samples may be taken from the blisters and examined before the diagnosis is made.  Keep your rash covered with a loose bandage (dressing). Wear loose-fitting clothing to help ease the pain of material rubbing against the rash.  Before your blisters change into scabs, your shingles infection can cause chickenpox in people who have never had it or have never been vaccinated against it. This information is not intended to replace  advice given to you by your health care provider. Make sure you discuss any questions you have with your health care provider. Document Released: 10/15/2005 Document Revised: 06/19/2017 Document Reviewed: 06/19/2017 Elsevier Interactive Patient Education  2019 [employer].

## 2018-12-09 NOTE — Progress Notes (Signed)
Subjective:    Patient ID: Victoria Caldwell, female    DOB: Jun 16, 1966, 53 y.o.   MRN: [id number]  DOS:  12/09/2018 Type of visit - description: Acute Symptoms started approximately 3 days ago, she felt pain in the left side of the neck and some "swollen glands". The area become red and itchy. Went to urgent care, was told she probably had infection prescribed Bactrim. She also noted blisters from the onset of symptoms. They are drying up.   Review of Systems Denies fever chills but she has a headache. No Ear Pain.  Mild Sore Throat.  Past Medical History:  Diagnosis Date  . Back pain   . Chickenpox   . GERD (gastroesophageal reflux disease)   . Joint pain   . OSA (obstructive sleep apnea) 09/10/2018  . Sinus problem     Past Surgical History:  Procedure Laterality Date  . BACK SURGERY    . LAPAROSCOPIC SUPRACERVICAL HYSTERECTOMY  06/26/2011   Procedure: LAPAROSCOPIC SUPRACERVICAL HYSTERECTOMY;  Surgeon: Logan Bores;  Location: [facility];  Service: Gynecology;  Laterality: N/A;  . SALPINGOOPHORECTOMY  06/26/2011   Procedure: SALPINGO OOPHERECTOMY;  Surgeon: Logan Bores;  Location: [facility];  Service: Gynecology;  Laterality: Bilateral;    Social History   Socioeconomic History  . Marital status: Married    Spouse name: Kishana Battey  . Number of children: Not on file  . Years of education: Not on file  . Highest education level: Not on file  Occupational History  . Occupation: Photographer  Social Needs  . Financial resource strain: Not on file  . Food insecurity:    Worry: Not on file    Inability: Not on file  . Transportation needs:    Medical: Not on file    Non-medical: Not on file  Tobacco Use  . Smoking status: Never Smoker  . Smokeless tobacco: Never Used  Substance and Sexual Activity  . Alcohol use: Yes    Alcohol/week: 1.0 standard drinks    Types: 1 Glasses of wine per week    Comment: daily  . Drug use: No  .  Sexual activity: Not on file  Lifestyle  . Physical activity:    Days per week: Not on file    Minutes per session: Not on file  . Stress: Not on file  Relationships  . Social connections:    Talks on phone: Not on file    Gets together: Not on file    Attends religious service: Not on file    Active member of club or organization: Not on file    Attends meetings of clubs or organizations: Not on file    Relationship status: Not on file  . Intimate partner violence:    Fear of current or ex partner: Not on file    Emotionally abused: Not on file    Physically abused: Not on file    Forced sexual activity: Not on file  Other Topics Concern  . Not on file  Social History Narrative   Exercise      Allergies as of 12/09/2018   No Known Allergies     Medication List       Accurate as of December 09, 2018  1:55 PM. Always use your most recent med list.        cetirizine 10 MG tablet Commonly known as:  ZYRTEC Take 10 mg by mouth daily.   famotidine 40 MG tablet Commonly known as:  PEPCID  Take 1 tablet (40 mg total) by mouth daily.   MULTIVITAMIN ADULT Chew Chew 2 each by mouth daily.   Vitamin D (Ergocalciferol) 1.25 MG (50000 UT) Caps capsule Commonly known as:  DRISDOL Take 1 capsule (50,000 Units total) by mouth every 7 (seven) days.           Objective:   Physical Exam Neck:     BP 116/72 (BP Location: Left Arm, Patient Position: Sitting, Cuff Size: Small)   Pulse 72   Temp 97.7 F (36.5 C) (Oral)   Resp 18   Ht 5\' 4"  (1.626 m)   Wt 193 lb 4 oz (87.7 kg)   LMP 05/28/2011   SpO2 93%   BMI 33.17 kg/m  General:   Well developed, NAD, BMI noted. HEENT:  Normocephalic . Face symmetric, atraumatic Throat symmetric, no redness. Neck: See graphic Skin: Not pale. Not jaundice Neurologic:  alert & oriented X3.  Speech normal, gait appropriate for age and unassisted Psych--  Cognition and judgment appear intact.  Cooperative with normal  attention span and concentration.  Behavior appropriate. No anxious or depressed appearing.      Assessment     53 year old female, PMH includes  GERD, OSA, presents with Shingles: Rash very suggestive of shingles. She did have Shingrix several weeks ago, unclear if that is related. Explained to patient what shingles is, what to expect, she knows she is contagious, prescribed Valtrex.  See AVS. Will call if not gradually better or if she developed neuropathy symptoms.

## 2018-12-09 NOTE — Progress Notes (Signed)
Pre visit review using our clinic review tool, if applicable. No additional management support is needed unless otherwise documented below in the visit note. 

## 2018-12-22 ENCOUNTER — Ambulatory Visit (INDEPENDENT_AMBULATORY_CARE_PROVIDER_SITE_OTHER): Payer: Federal, State, Local not specified - PPO | Admitting: Physician Assistant

## 2018-12-22 ENCOUNTER — Encounter (INDEPENDENT_AMBULATORY_CARE_PROVIDER_SITE_OTHER): Payer: Self-pay

## 2018-12-23 ENCOUNTER — Encounter (INDEPENDENT_AMBULATORY_CARE_PROVIDER_SITE_OTHER): Payer: Self-pay | Admitting: Physician Assistant

## 2019-01-08 ENCOUNTER — Ambulatory Visit (INDEPENDENT_AMBULATORY_CARE_PROVIDER_SITE_OTHER): Payer: Federal, State, Local not specified - PPO | Admitting: Physician Assistant

## 2019-01-22 ENCOUNTER — Encounter (INDEPENDENT_AMBULATORY_CARE_PROVIDER_SITE_OTHER): Payer: Self-pay

## 2019-07-09 ENCOUNTER — Encounter: Payer: Federal, State, Local not specified - PPO | Admitting: Family Medicine

## 2019-07-09 DIAGNOSIS — Z0289 Encounter for other administrative examinations: Secondary | ICD-10-CM

## 2019-07-22 DIAGNOSIS — H2513 Age-related nuclear cataract, bilateral: Secondary | ICD-10-CM | POA: Diagnosis not present

## 2019-07-22 DIAGNOSIS — H04123 Dry eye syndrome of bilateral lacrimal glands: Secondary | ICD-10-CM | POA: Diagnosis not present

## 2019-10-07 ENCOUNTER — Encounter: Payer: Self-pay | Admitting: Family Medicine

## 2019-10-07 ENCOUNTER — Other Ambulatory Visit: Payer: Self-pay

## 2019-10-07 ENCOUNTER — Ambulatory Visit (INDEPENDENT_AMBULATORY_CARE_PROVIDER_SITE_OTHER): Payer: Federal, State, Local not specified - PPO | Admitting: Family Medicine

## 2019-10-07 DIAGNOSIS — Z20822 Contact with and (suspected) exposure to covid-19: Secondary | ICD-10-CM | POA: Insufficient documentation

## 2019-10-07 DIAGNOSIS — J0140 Acute pansinusitis, unspecified: Secondary | ICD-10-CM | POA: Diagnosis not present

## 2019-10-07 DIAGNOSIS — Z20828 Contact with and (suspected) exposure to other viral communicable diseases: Secondary | ICD-10-CM | POA: Diagnosis not present

## 2019-10-07 MED ORDER — FLUTICASONE PROPIONATE 50 MCG/ACT NA SUSP: 2.0000 | Freq: Every day | NASAL | 6 refills | Status: DC

## 2019-10-07 MED ORDER — AMOXICILLIN-POT CLAVULANATE 875-125 MG PO TABS: 1.0000 | ORAL_TABLET | Freq: Two times a day (BID) | ORAL | 0 refills | Status: DC

## 2019-10-07 NOTE — Progress Notes (Signed)
Virtual Visit via Video Note  I connected with Victoria Caldwell on 10/07/19 at 10:40 AM EST by a video enabled telemedicine application and verified that I am speaking with the correct person using two identifiers.  Location: Patient: home  Provider: home    I discussed the limitations of evaluation and management by telemedicine and the availability of in person appointments. The patient expressed understanding and agreed to proceed.  History of Present Illness: Pt is home c/o sinus congestion --- it started before thanksgiving with sore throat , headache + headache   She is taking mucinex ,  Nyquil, dayquil  , tyleno with little relief   Has not checked fevers,  + bodyaches    Past Medical History:  Diagnosis Date  . Back pain   . Chickenpox   . GERD (gastroesophageal reflux disease)   . Joint pain   . OSA (obstructive sleep apnea) 09/10/2018  . Sinus problem    Current Outpatient Medications on File Prior to Visit  Medication Sig Dispense Refill  . cetirizine (ZYRTEC) 10 MG tablet Take 10 mg by mouth daily.    . famotidine (PEPCID) 40 MG tablet Take 1 tablet (40 mg total) by mouth daily. 90 tablet 3  . Multiple Vitamins-Minerals (MULTIVITAMIN ADULT) CHEW Chew 2 each by mouth daily.    . valACYclovir (VALTREX) 1000 MG tablet Take 1 tablet (1,000 mg total) by mouth 3 (three) times daily. 21 tablet 0  . Vitamin D, Ergocalciferol, (DRISDOL) 1.25 MG (50000 UT) CAPS capsule Take 1 capsule (50,000 Units total) by mouth every 7 (seven) days. 4 capsule 0   No current facility-administered medications on file prior to visit.     Observations/Objective: No fever per pt  No other vitals obtained Pt is in NAD Assessment and Plan: 1. Acute non-recurrent pansinusitis abx per orders con't antihistamine flonase ordered as well covid test at [facility] or [facility]  - amoxicillin-clavulanate (AUGMENTIN) 875-125 MG tablet; Take 1 tablet by mouth 2 (two) times daily.  Dispense: 20 tablet; Refill:  0 - fluticasone (FLONASE) 50 MCG/ACT nasal spray; Place 2 sprays into both nostrils daily.  Dispense: 16 g; Refill: 6   Follow Up Instructions:    I discussed the assessment and treatment plan with the patient. The patient was provided an opportunity to ask questions and all were answered. The patient agreed with the plan and demonstrated an understanding of the instructions.   The patient was advised to call back or seek an in-person evaluation if the symptoms worsen or if the condition fails to improve as anticipated.  I provided 15 minutes of non-face-to-face time during this encounter.   Ann Held, DO

## 2019-12-23 DIAGNOSIS — Z20828 Contact with and (suspected) exposure to other viral communicable diseases: Secondary | ICD-10-CM | POA: Diagnosis not present

## 2019-12-23 DIAGNOSIS — R430 Anosmia: Secondary | ICD-10-CM | POA: Diagnosis not present

## 2019-12-23 DIAGNOSIS — U071 COVID-19: Secondary | ICD-10-CM | POA: Diagnosis not present

## 2019-12-23 DIAGNOSIS — R5383 Other fatigue: Secondary | ICD-10-CM | POA: Diagnosis not present

## 2020-08-16 ENCOUNTER — Other Ambulatory Visit (HOSPITAL_BASED_OUTPATIENT_CLINIC_OR_DEPARTMENT_OTHER): Payer: Self-pay | Admitting: Family Medicine

## 2020-08-16 ENCOUNTER — Other Ambulatory Visit: Payer: Self-pay

## 2020-08-16 ENCOUNTER — Ambulatory Visit (INDEPENDENT_AMBULATORY_CARE_PROVIDER_SITE_OTHER): Payer: Federal, State, Local not specified - PPO | Admitting: Family Medicine

## 2020-08-16 ENCOUNTER — Encounter: Payer: Self-pay | Admitting: Family Medicine

## 2020-08-16 VITALS — BP 110/70 | HR 85 | Temp 99.2°F | Resp 18 | Ht 64.0 in | Wt 199.8 lb

## 2020-08-16 DIAGNOSIS — Z1231 Encounter for screening mammogram for malignant neoplasm of breast: Secondary | ICD-10-CM

## 2020-08-16 DIAGNOSIS — Z0000 Encounter for general adult medical examination without abnormal findings: Secondary | ICD-10-CM | POA: Diagnosis not present

## 2020-08-16 DIAGNOSIS — J0140 Acute pansinusitis, unspecified: Secondary | ICD-10-CM | POA: Diagnosis not present

## 2020-08-16 DIAGNOSIS — J302 Other seasonal allergic rhinitis: Secondary | ICD-10-CM

## 2020-08-16 DIAGNOSIS — E2839 Other primary ovarian failure: Secondary | ICD-10-CM | POA: Diagnosis not present

## 2020-08-16 MED ORDER — FEXOFENADINE HCL 180 MG PO TABS: 180.0000 mg | ORAL_TABLET | Freq: Every day | ORAL | 0 refills | Status: AC

## 2020-08-16 MED ORDER — FLUTICASONE PROPIONATE 50 MCG/ACT NA SUSP: 2.0000 | Freq: Every day | NASAL | 6 refills | Status: DC

## 2020-08-16 NOTE — Progress Notes (Signed)
Subjective:     Victoria Caldwell is a 54 y.o. female and is here for a comprehensive physical exam. The patient reports no problems.   Social History   Socioeconomic History  . Marital status: Married    Spouse name: Victoria Caldwell  . Number of children: Not on file  . Years of education: Not on file  . Highest education level: Not on file  Occupational History  . Occupation: Photographer  Tobacco Use  . Smoking status: Never Smoker  . Smokeless tobacco: Never Used  Substance and Sexual Activity  . Alcohol use: Yes    Alcohol/week: 1.0 standard drink    Types: 1 Glasses of wine per week    Comment: daily  . Drug use: No  . Sexual activity: Not on file  Other Topics Concern  . Not on file  Social History Narrative   Exercise   Social Determinants of Health   Financial Resource Strain:   . Difficulty of Paying Living Expenses: Not on file  Food Insecurity:   . Worried About Charity fundraiser in the Last Year: Not on file  . Ran Out of Food in the Last Year: Not on file  Transportation Needs:   . Lack of Transportation (Medical): Not on file  . Lack of Transportation (Non-Medical): Not on file  Physical Activity:   . Days of Exercise per Week: Not on file  . Minutes of Exercise per Session: Not on file  Stress:   . Feeling of Stress : Not on file  Social Connections:   . Frequency of Communication with Friends and Family: Not on file  . Frequency of Social Gatherings with Friends and Family: Not on file  . Attends Religious Services: Not on file  . Active Member of Clubs or Organizations: Not on file  . Attends Archivist Meetings: Not on file  . Marital Status: Not on file  Intimate Partner Violence:   . Fear of Current or Ex-Partner: Not on file  . Emotionally Abused: Not on file  . Physically Abused: Not on file  . Sexually Abused: Not on file   Health Maintenance  Topic Date Due  . PAP SMEAR-Modifier  12/30/2018   . MAMMOGRAM  07/13/2019  . INFLUENZA VACCINE  05/29/2020  . TETANUS/TDAP  10/29/2020  . COLONOSCOPY  02/26/2026  . COVID-19 Vaccine  Completed  . Hepatitis C Screening  Completed  . HIV Screening  Completed    The following portions of the patient's history were reviewed and updated as appropriate:  She  has a past medical history of Back pain, Chickenpox, GERD (gastroesophageal reflux disease), Joint pain, OSA (obstructive sleep apnea) (09/10/2018), and Sinus problem. She does not have any pertinent problems on file. She  has a past surgical history that includes Back surgery; Laparoscopic supracervical hysterectomy (06/26/2011); and Salpingoophorectomy (06/26/2011). Her family history includes AAA (abdominal aortic aneurysm) in her father; Alcoholism in her father; Cancer (age of onset: 61) in her mother; Heart attack (age of onset: 62) in her father; Hypertension in her father and another family member; Kidney disease in her father; Obesity in her father; Ovarian cysts in an other family member; Sudden death in her father. She  reports that she has never smoked. She has never used smokeless tobacco. She reports current alcohol use of about 1.0 standard drink of alcohol per week. She reports that she does not use drugs. She has a current medication list which includes the  following prescription(s): famotidine, fluticasone, multivitamin adult, valacyclovir, vitamin d (ergocalciferol), and amoxicillin-clavulanate. Current Outpatient Medications on File Prior to Visit  Medication Sig Dispense Refill  . famotidine (PEPCID) 40 MG tablet Take 1 tablet (40 mg total) by mouth daily. 90 tablet 3  . fluticasone (FLONASE) 50 MCG/ACT nasal spray Place 2 sprays into both nostrils daily. 16 g 6  . Multiple Vitamins-Minerals (MULTIVITAMIN ADULT) CHEW Chew 2 each by mouth daily.    . valACYclovir (VALTREX) 1000 MG tablet Take 1 tablet (1,000 mg total) by mouth 3 (three) times daily. 21 tablet 0  . Vitamin D,  Ergocalciferol, (DRISDOL) 1.25 MG (50000 UT) CAPS capsule Take 1 capsule (50,000 Units total) by mouth every 7 (seven) days. 4 capsule 0  . amoxicillin-clavulanate (AUGMENTIN) 875-125 MG tablet Take 1 tablet by mouth 2 (two) times daily. (Patient not taking: Reported on 08/16/2020) 20 tablet 0   No current facility-administered medications on file prior to visit.   She has No Known Allergies..  Review of Systems Review of Systems  Constitutional: Negative for activity change, appetite change and fatigue.  HENT: Negative for hearing loss, congestion, tinnitus and ear discharge.  dentist q68m Eyes: Negative for visual disturbance (see optho q1y -- vision corrected to 20/20 with glasses).  Respiratory: Negative for cough, chest tightness and shortness of breath.   Cardiovascular: Negative for chest pain, palpitations and leg swelling.  Gastrointestinal: Negative for abdominal pain, diarrhea, constipation and abdominal distention.  Genitourinary: Negative for urgency, frequency, decreased urine volume and difficulty urinating.  Musculoskeletal: Negative for back pain, arthralgias and gait problem.  Skin: Negative for color change, pallor and rash.  Neurological: Negative for dizziness, light-headedness, numbness and headaches.  Hematological: Negative for adenopathy. Does not bruise/bleed easily.  Psychiatric/Behavioral: Negative for suicidal ideas, confusion, sleep disturbance, self-injury, dysphoric mood, decreased concentration and agitation.       Objective:    BP 110/70 (BP Location: Left Arm, Patient Position: Sitting, Cuff Size: Large)   Pulse 85   Temp 99.2 F (37.3 C) (Oral)   Resp 18   Ht 5\' 4"  (1.626 m)   Wt 199 lb 12.8 oz (90.6 kg)   LMP 05/28/2011   SpO2 97%   BMI 34.30 kg/m  General appearance: alert, cooperative, appears stated age and no distress Head: Normocephalic, without obvious abnormality, atraumatic Eyes: conjunctivae/corneas clear. PERRL, EOM's intact.  Fundi benign. Ears: normal TM's and external ear canals both ears Neck: no adenopathy, no carotid bruit, no JVD, supple, symmetrical, trachea midline and thyroid not enlarged, symmetric, no tenderness/mass/nodules Back: symmetric, no curvature. ROM normal. No CVA tenderness. Lungs: clear to auscultation bilaterally Breasts: normal appearance, no masses or tenderness Heart: regular rate and rhythm, S1, S2 normal, no murmur, click, rub or gallop Abdomen: soft, non-tender; bowel sounds normal; no masses,  no organomegaly Pelvic: not indicated; status post hysterectomy, negative ROS Extremities: extremities normal, atraumatic, no cyanosis or edema Pulses: 2+ and symmetric Skin: Skin color, texture, turgor normal. No rashes or lesions Lymph nodes: Cervical, supraclavicular, and axillary nodes normal. Neurologic: Alert and oriented X 3, normal strength and tone. Normal symmetric reflexes. Normal coordination and gait    Assessment:    Healthy female exam.      Plan:  Ghm utd Check labs    See After Visit Summary for Counseling Recommendations    1. Seasonal allergies Stable  - fexofenadine (ALLEGRA ALLERGY) 180 MG tablet; Take 1 tablet (180 mg total) by mouth daily.  Dispense: 30 tablet; Refill: 0  - fluticasone (  FLONASE) 50 MCG/ACT nasal spray; Place 2 sprays into both nostrils daily.  Dispense: 16 g; Refill: 6  2. Estrogen deficiency  - DG Bone Density; Future - Lipid panel - TSH - CBC with Differential/Platelet - Comprehensive metabolic panel  3. Preventative health care See above  - Lipid panel - TSH - CBC with Differential/Platelet - Comprehensive metabolic panel

## 2020-08-16 NOTE — Patient Instructions (Signed)
Preventive Care 40-56 Years Old, Female Old, Female Preventive care refers to visits with your health care provider and lifestyle choices that can promote health and wellness. This includes:  A yearly physical exam. This may also be called an annual well check.  Regular dental visits and eye exams.  Immunizations.  Screening for certain conditions.  Healthy lifestyle choices, such as eating a healthy diet, getting regular exercise, not using drugs or products that contain nicotine and tobacco, and limiting alcohol use. What can I expect for my preventive care visit? Physical exam Your health care provider will check your:  Height and weight. This may be used to calculate body mass index (BMI), which tells if you are at a healthy weight.  Heart rate and blood pressure.  Skin for abnormal spots. Counseling Your health care provider may ask you questions about your:  Alcohol, tobacco, and drug use.  Emotional well-being.  Home and relationship well-being.  Sexual activity.  Eating habits.  Work and work environment.  Method of birth control.  Menstrual cycle.  Pregnancy history. What immunizations do I need?  Influenza (flu) vaccine  This is recommended every year. Tetanus, diphtheria, and pertussis (Tdap) vaccine  You may need a Td booster every 10 years. Varicella (chickenpox) vaccine  You may need this if you have not been vaccinated. Zoster (shingles) vaccine  You may need this after age 56. Measles, mumps, and rubella (MMR) vaccine  You may need at least one dose of MMR if you were born in 1957 or later. You may also need a second dose. Pneumococcal conjugate (PCV13) vaccine  You may need this if you have certain conditions and were not previously vaccinated. Pneumococcal polysaccharide (PPSV23) vaccine  You may need one or two doses if you smoke cigarettes or if you have certain conditions. Meningococcal conjugate (MenACWY) vaccine  You may need this if you  have certain conditions. Hepatitis A vaccine  You may need this if you have certain conditions or if you travel or work in places where you may be exposed to hepatitis A. Hepatitis B vaccine  You may need this if you have certain conditions or if you travel or work in places where you may be exposed to hepatitis B. Haemophilus influenzae type b (Hib) vaccine  You may need this if you have certain conditions. Human papillomavirus (HPV) vaccine  If recommended by your health care provider, you may need three doses over 6 months. You may receive vaccines as individual doses or as more than one vaccine together in one shot (combination vaccines). Talk with your health care provider about the risks and benefits of combination vaccines. What tests do I need? Blood tests  Lipid and cholesterol levels. These may be checked every 5 years, or more frequently if you are over 50 years old.  Hepatitis C test.  Hepatitis B test. Screening  Lung cancer screening. You may have this screening every year starting at age 56 if you have a 30-pack-year history of smoking and currently smoke or have quit within the past 15 years.  Colorectal cancer screening. All adults should have this screening starting at age 56 and continuing until age 75. Your health care provider may recommend screening at age 56 if you are at increased risk. You will have tests every 1-10 years, depending on your results and the type of screening test.  Diabetes screening. This is done by checking your blood sugar (glucose) after you have not eaten for a while (fasting). You may have this   done every 1-3 years.  Mammogram. This may be done every 1-2 years. Talk with your health care provider about when you should start having regular mammograms. This may depend on whether you have a family history of breast cancer.  BRCA-related cancer screening. This may be done if you have a family history of breast, ovarian, tubal, or peritoneal  cancers.  Pelvic exam and Pap test. This may be done every 3 years starting at age 56. Starting at age 56, this may be done every 5 years if you have a Pap test in combination with an HPV test. Other tests  Sexually transmitted disease (STD) testing.  Bone density scan. This is done to screen for osteoporosis. You may have this scan if you are at high risk for osteoporosis. Follow these instructions at home: Eating and drinking  Eat a diet that includes fresh fruits and vegetables, whole grains, lean protein, and low-fat dairy.  Take vitamin and mineral supplements as recommended by your health care provider.  Do not drink alcohol if: ? Your health care provider tells you not to drink. ? You are pregnant, may be pregnant, or are planning to become pregnant.  If you drink alcohol: ? Limit how much you have to 0-1 drink a day. ? Be aware of how much alcohol is in your drink. In the [place] one drink equals one 12 oz bottle of beer (355 mL), one 5 oz glass of wine (148 mL), or one 1 oz glass of hard liquor (44 mL). Lifestyle  Take daily care of your teeth and gums.  Stay active. Exercise for at least 30 minutes on 5 or more days each week.  Do not use any products that contain nicotine or tobacco, such as cigarettes, e-cigarettes, and chewing tobacco. If you need help quitting, ask your health care provider.  If you are sexually active, practice safe sex. Use a condom or other form of birth control (contraception) in order to prevent pregnancy and STIs (sexually transmitted infections).  If told by your health care provider, take low-dose aspirin daily starting at age 56. What's next?  Visit your health care provider once a year for a well check visit.  Ask your health care provider how often you should have your eyes and teeth checked.  Stay up to date on all vaccines. This information is not intended to replace advice given to you by your health care provider. Make sure you  discuss any questions you have with your health care provider. Document Revised: 06/26/2018 Document Reviewed: 06/26/2018 Elsevier Patient Education  2020 [employer].

## 2020-08-17 ENCOUNTER — Encounter: Payer: Self-pay | Admitting: Family Medicine

## 2020-08-17 LAB — COMPREHENSIVE METABOLIC PANEL
AG Ratio: 1.6 (calc) (ref 1.0–2.5)
ALT: 15 U/L (ref 6–29)
AST: 16 U/L (ref 10–35)
Albumin: 4.4 g/dL (ref 3.6–5.1)
Alkaline phosphatase (APISO): 58 U/L (ref 37–153)
BUN: 14 mg/dL (ref 7–25)
CO2: 29 mmol/L (ref 20–32)
Calcium: 9.3 mg/dL (ref 8.6–10.4)
Chloride: 103 mmol/L (ref 98–110)
Creat: 0.87 mg/dL (ref 0.50–1.05)
Globulin: 2.7 g/dL (calc) (ref 1.9–3.7)
Glucose, Bld: 111 mg/dL — ABNORMAL HIGH (ref 65–99)
Potassium: 4.2 mmol/L (ref 3.5–5.3)
Sodium: 139 mmol/L (ref 135–146)
Total Bilirubin: 0.4 mg/dL (ref 0.2–1.2)
Total Protein: 7.1 g/dL (ref 6.1–8.1)

## 2020-08-17 LAB — CBC WITH DIFFERENTIAL/PLATELET
Absolute Monocytes: 423 cells/uL (ref 200–950)
Basophils Absolute: 33 cells/uL (ref 0–200)
Basophils Relative: 0.5 %
Eosinophils Absolute: 98 cells/uL (ref 15–500)
Eosinophils Relative: 1.5 %
HCT: 38.6 % (ref 35.0–45.0)
Hemoglobin: 12.6 g/dL (ref 11.7–15.5)
Lymphs Abs: 2763 cells/uL (ref 850–3900)
MCH: 28.2 pg (ref 27.0–33.0)
MCHC: 32.6 g/dL (ref 32.0–36.0)
MCV: 86.4 fL (ref 80.0–100.0)
MPV: 11.3 fL (ref 7.5–12.5)
Monocytes Relative: 6.5 %
Neutro Abs: 3185 cells/uL (ref 1500–7800)
Neutrophils Relative %: 49 %
Platelets: 232 10*3/uL (ref 140–400)
RBC: 4.47 10*6/uL (ref 3.80–5.10)
RDW: 14 % (ref 11.0–15.0)
Total Lymphocyte: 42.5 %
WBC: 6.5 10*3/uL (ref 3.8–10.8)

## 2020-08-17 LAB — LIPID PANEL
Cholesterol: 193 mg/dL (ref <200)
HDL: 67 mg/dL (ref >=50)
LDL Cholesterol (Calc): 109 mg/dL (calc) — ABNORMAL HIGH
Non-HDL Cholesterol (Calc): 126 mg/dL (calc) (ref <130)
Total CHOL/HDL Ratio: 2.9 (calc) (ref <5.0)
Triglycerides: 81 mg/dL (ref <150)

## 2020-08-17 LAB — TSH: TSH: 2.15 mIU/L (ref 0.40–4.50)

## 2020-08-18 MED ORDER — FAMOTIDINE 40 MG PO TABS: 40.0000 mg | ORAL_TABLET | Freq: Every day | ORAL | 3 refills | Status: DC

## 2020-08-21 ENCOUNTER — Other Ambulatory Visit: Payer: Self-pay | Admitting: Family Medicine

## 2020-08-21 DIAGNOSIS — E1165 Type 2 diabetes mellitus with hyperglycemia: Secondary | ICD-10-CM

## 2020-08-29 DIAGNOSIS — H04123 Dry eye syndrome of bilateral lacrimal glands: Secondary | ICD-10-CM | POA: Diagnosis not present

## 2020-08-29 DIAGNOSIS — H2513 Age-related nuclear cataract, bilateral: Secondary | ICD-10-CM | POA: Diagnosis not present

## 2020-10-10 ENCOUNTER — Encounter (HOSPITAL_BASED_OUTPATIENT_CLINIC_OR_DEPARTMENT_OTHER): Payer: Self-pay

## 2020-10-10 ENCOUNTER — Ambulatory Visit (HOSPITAL_BASED_OUTPATIENT_CLINIC_OR_DEPARTMENT_OTHER): Payer: Federal, State, Local not specified - PPO

## 2020-10-10 ENCOUNTER — Other Ambulatory Visit: Payer: Self-pay

## 2020-10-10 ENCOUNTER — Other Ambulatory Visit (HOSPITAL_BASED_OUTPATIENT_CLINIC_OR_DEPARTMENT_OTHER): Payer: Federal, State, Local not specified - PPO

## 2020-10-10 ENCOUNTER — Ambulatory Visit (HOSPITAL_BASED_OUTPATIENT_CLINIC_OR_DEPARTMENT_OTHER)
Admission: RE | Admit: 2020-10-10 | Discharge: 2020-10-10 | Disposition: A | Discharge status: Home / Self Care | Payer: Federal, State, Local not specified - PPO | Source: Ambulatory Visit | Attending: Family Medicine | Admitting: Family Medicine

## 2020-10-10 DIAGNOSIS — E2839 Other primary ovarian failure: Secondary | ICD-10-CM

## 2020-10-10 DIAGNOSIS — Z90710 Acquired absence of both cervix and uterus: Secondary | ICD-10-CM | POA: Diagnosis not present

## 2020-10-10 DIAGNOSIS — M8589 Other specified disorders of bone density and structure, multiple sites: Secondary | ICD-10-CM | POA: Diagnosis not present

## 2020-10-10 DIAGNOSIS — Z1231 Encounter for screening mammogram for malignant neoplasm of breast: Secondary | ICD-10-CM

## 2020-10-10 DIAGNOSIS — Z780 Asymptomatic menopausal state: Secondary | ICD-10-CM | POA: Diagnosis not present

## 2020-10-11 ENCOUNTER — Other Ambulatory Visit (HOSPITAL_BASED_OUTPATIENT_CLINIC_OR_DEPARTMENT_OTHER): Payer: Federal, State, Local not specified - PPO

## 2020-10-11 ENCOUNTER — Ambulatory Visit (HOSPITAL_BASED_OUTPATIENT_CLINIC_OR_DEPARTMENT_OTHER): Payer: Federal, State, Local not specified - PPO

## 2020-10-23 ENCOUNTER — Other Ambulatory Visit: Payer: Self-pay | Admitting: Family Medicine

## 2020-10-23 DIAGNOSIS — J0140 Acute pansinusitis, unspecified: Secondary | ICD-10-CM

## 2020-12-05 DIAGNOSIS — R0982 Postnasal drip: Secondary | ICD-10-CM | POA: Diagnosis not present

## 2020-12-05 DIAGNOSIS — J029 Acute pharyngitis, unspecified: Secondary | ICD-10-CM | POA: Diagnosis not present

## 2021-07-24 ENCOUNTER — Other Ambulatory Visit: Payer: Self-pay | Admitting: Family Medicine

## 2021-08-17 ENCOUNTER — Encounter: Payer: Federal, State, Local not specified - PPO | Admitting: Family Medicine

## 2021-11-06 ENCOUNTER — Encounter: Payer: Self-pay | Admitting: Family Medicine

## 2021-11-06 ENCOUNTER — Ambulatory Visit (INDEPENDENT_AMBULATORY_CARE_PROVIDER_SITE_OTHER): Payer: Federal, State, Local not specified - PPO | Admitting: Family Medicine

## 2021-11-06 VITALS — BP 140/90 | HR 63 | Temp 97.6°F | Resp 18 | Ht 64.0 in | Wt 212.6 lb

## 2021-11-06 DIAGNOSIS — Z23 Encounter for immunization: Secondary | ICD-10-CM

## 2021-11-06 DIAGNOSIS — Z0001 Encounter for general adult medical examination with abnormal findings: Secondary | ICD-10-CM | POA: Diagnosis not present

## 2021-11-06 DIAGNOSIS — Z0000 Encounter for general adult medical examination without abnormal findings: Secondary | ICD-10-CM | POA: Insufficient documentation

## 2021-11-06 DIAGNOSIS — R030 Elevated blood-pressure reading, without diagnosis of hypertension: Secondary | ICD-10-CM

## 2021-11-06 DIAGNOSIS — I10 Essential (primary) hypertension: Secondary | ICD-10-CM | POA: Insufficient documentation

## 2021-11-06 LAB — CBC WITH DIFFERENTIAL/PLATELET
Basophils Absolute: 0 10*3/uL (ref 0.0–0.1)
Basophils Relative: 0.5 % (ref 0.0–3.0)
Eosinophils Absolute: 0.1 10*3/uL (ref 0.0–0.7)
Eosinophils Relative: 1.1 % (ref 0.0–5.0)
HCT: 39.3 % (ref 36.0–46.0)
Hemoglobin: 12.4 g/dL (ref 12.0–15.0)
Lymphocytes Relative: 36 % (ref 12.0–46.0)
Lymphs Abs: 2.1 10*3/uL (ref 0.7–4.0)
MCHC: 31.6 g/dL (ref 30.0–36.0)
MCV: 85.7 fl (ref 78.0–100.0)
Monocytes Absolute: 0.4 10*3/uL (ref 0.1–1.0)
Monocytes Relative: 6.8 % (ref 3.0–12.0)
Neutro Abs: 3.3 10*3/uL (ref 1.4–7.7)
Neutrophils Relative %: 55.6 % (ref 43.0–77.0)
Platelets: 251 10*3/uL (ref 150.0–400.0)
RBC: 4.59 Mil/uL (ref 3.87–5.11)
RDW: 15.1 % (ref 11.5–15.5)
WBC: 5.9 10*3/uL (ref 4.0–10.5)

## 2021-11-06 LAB — COMPREHENSIVE METABOLIC PANEL
ALT: 19 U/L (ref 0–35)
AST: 17 U/L (ref 0–37)
Albumin: 4.5 g/dL (ref 3.5–5.2)
Alkaline Phosphatase: 55 U/L (ref 39–117)
BUN: 13 mg/dL (ref 6–23)
CO2: 26 mEq/L (ref 19–32)
Calcium: 9.4 mg/dL (ref 8.4–10.5)
Chloride: 105 mEq/L (ref 96–112)
Creatinine, Ser: 0.72 mg/dL (ref 0.40–1.20)
GFR: 92.81 mL/min (ref >60.00)
Glucose, Bld: 86 mg/dL (ref 70–99)
Potassium: 4.2 mEq/L (ref 3.5–5.1)
Sodium: 138 mEq/L (ref 135–145)
Total Bilirubin: 0.4 mg/dL (ref 0.2–1.2)
Total Protein: 7.4 g/dL (ref 6.0–8.3)

## 2021-11-06 LAB — LIPID PANEL
Cholesterol: 183 mg/dL (ref 0–200)
HDL: 63.4 mg/dL (ref >39.00)
LDL Cholesterol: 103 mg/dL — ABNORMAL HIGH (ref 0–99)
NonHDL: 119.68
Total CHOL/HDL Ratio: 3
Triglycerides: 85 mg/dL (ref 0.0–149.0)
VLDL: 17 mg/dL (ref 0.0–40.0)

## 2021-11-06 LAB — TSH: TSH: 2.81 u[IU]/mL (ref 0.35–5.50)

## 2021-11-06 MED ORDER — FAMOTIDINE 40 MG PO TABS: ORAL_TABLET | ORAL | 1 refills | Status: DC

## 2021-11-06 NOTE — Patient Instructions (Addendum)
Preventive Care 40-58 Years Old, Female °Preventive care refers to lifestyle choices and visits with your health care provider that can promote health and wellness. Preventive care visits are also called wellness exams. °What can I expect for my preventive care visit? °Counseling °Your health care provider may ask you questions about your: °Medical history, including: °Past medical problems. °Family medical history. °Pregnancy history. °Current health, including: °Menstrual cycle. °Method of birth control. °Emotional well-being. °Home life and relationship well-being. °Sexual activity and sexual health. °Lifestyle, including: °Alcohol, nicotine or tobacco, and drug use. °Access to firearms. °Diet, exercise, and sleep habits. °Work and work environment. °Sunscreen use. °Safety issues such as seatbelt and bike helmet use. °Physical exam °Your health care provider will check your: °Height and weight. These may be used to calculate your BMI (body mass index). BMI is a measurement that tells if you are at a healthy weight. °Waist circumference. This measures the distance around your waistline. This measurement also tells if you are at a healthy weight and may help predict your risk of certain diseases, such as type 2 diabetes and high blood pressure. °Heart rate and blood pressure. °Body temperature. °Skin for abnormal spots. °What immunizations do I need? °Vaccines are usually given at various ages, according to a schedule. Your health care provider will recommend vaccines for you based on your age, medical history, and lifestyle or other factors, such as travel or where you work. °What tests do I need? °Screening °Your health care provider may recommend screening tests for certain conditions. This may include: °Lipid and cholesterol levels. °Diabetes screening. This is done by checking your blood sugar (glucose) after you have not eaten for a while (fasting). °Pelvic exam and Pap test. °Hepatitis B test. °Hepatitis C  test. °HIV (human immunodeficiency virus) test. °STI (sexually transmitted infection) testing, if you are at risk. °Lung cancer screening. °Colorectal cancer screening. °Mammogram. Talk with your health care provider about when you should start having regular mammograms. This may depend on whether you have a family history of breast cancer. °BRCA-related cancer screening. This may be done if you have a family history of breast, ovarian, tubal, or peritoneal cancers. °Bone density scan. This is done to screen for osteoporosis. °Talk with your health care provider about your test results, treatment options, and if necessary, the need for more tests. °Follow these instructions at home: °Eating and drinking ° °Eat a diet that includes fresh fruits and vegetables, whole grains, lean protein, and low-fat dairy products. °Take vitamin and mineral supplements as recommended by your health care provider. °Do not drink alcohol if: °Your health care provider tells you not to drink. °You are pregnant, may be pregnant, or are planning to become pregnant. °If you drink alcohol: °Limit how much you have to 0-1 drink a day. °Know how much alcohol is in your drink. In the [place] one drink equals one 12 oz bottle of beer (355 mL), one 5 oz glass of wine (148 mL), or one 1½ oz glass of hard liquor (44 mL). °Lifestyle °Brush your teeth every morning and night with fluoride toothpaste. Floss one time each day. °Exercise for at least 30 minutes 5 or more days each week. °Do not use any products that contain nicotine or tobacco. These products include cigarettes, chewing tobacco, and vaping devices, such as e-cigarettes. If you need help quitting, ask your health care provider. °Do not use drugs. °If you are sexually active, practice safe sex. Use a condom or other form of protection to prevent   STIs. If you do not wish to become pregnant, use a form of birth control. If you plan to become pregnant, see your health care provider for a  prepregnancy visit. Take aspirin only as told by your health care provider. Make sure that you understand how much to take and what form to take. Work with your health care provider to find out whether it is safe and beneficial for you to take aspirin daily. Find healthy ways to manage stress, such as: Meditation, yoga, or listening to music. Journaling. Talking to a trusted person. Spending time with friends and family. Minimize exposure to UV radiation to reduce your risk of skin cancer. Safety Always wear your seat belt while driving or riding in a vehicle. Do not drive: If you have been drinking alcohol. Do not ride with someone who has been drinking. When you are tired or distracted. While texting. If you have been using any mind-altering substances or drugs. Wear a helmet and other protective equipment during sports activities. If you have firearms in your house, make sure you follow all gun safety procedures. Seek help if you have been physically or sexually abused. What's next? Visit your health care provider once a year for an annual wellness visit. Ask your health care provider how often you should have your eyes and teeth checked. Stay up to date on all vaccines. This information is not intended to replace advice given to you by your health care provider. Make sure you discuss any questions you have with your health care provider. Document Revised: 04/12/2021 Document Reviewed: 04/12/2021 Elsevier Patient Education  2022 [place] Eating Plan DASH stands for Dietary Approaches to Stop Hypertension. The DASH eating plan is a healthy eating plan that has been shown to: Reduce high blood pressure (hypertension). Reduce your risk for type 2 diabetes, heart disease, and stroke. Help with weight loss. What are tips for following this plan? Reading food labels Check food labels for the amount of salt (sodium) per serving. Choose foods with less than 5 percent of the  Daily Value of sodium. Generally, foods with less than 300 milligrams (mg) of sodium per serving fit into this eating plan. To find whole grains, look for the word "whole" as the first word in the ingredient list. Shopping Buy products labeled as "low-sodium" or "no salt added." Buy fresh foods. Avoid canned foods and pre-made or frozen meals. Cooking Avoid adding salt when cooking. Use salt-free seasonings or herbs instead of table salt or sea salt. Check with your health care provider or pharmacist before using salt substitutes. Do not fry foods. Cook foods using healthy methods such as baking, boiling, grilling, roasting, and broiling instead. Cook with heart-healthy oils, such as olive, canola, avocado, soybean, or sunflower oil. Meal planning  Eat a balanced diet that includes: 4 or more servings of fruits and 4 or more servings of vegetables each day. Try to fill one-half of your plate with fruits and vegetables. 6-8 servings of whole grains each day. Less than 6 oz (170 g) of lean meat, poultry, or fish each day. A 3-oz (85-g) serving of meat is about the same size as a deck of cards. One egg equals 1 oz (28 g). 2-3 servings of low-fat dairy each day. One serving is 1 cup (237 mL). 1 serving of nuts, seeds, or beans 5 times each week. 2-3 servings of heart-healthy fats. Healthy fats called omega-3 fatty acids are found in foods such as walnuts, flaxseeds, fortified milks, and eggs.  These fats are also found in cold-water fish, such as sardines, salmon, and mackerel. Limit how much you eat of: Canned or prepackaged foods. Food that is high in trans fat, such as some fried foods. Food that is high in saturated fat, such as fatty meat. Desserts and other sweets, sugary drinks, and other foods with added sugar. Full-fat dairy products. Do not salt foods before eating. Do not eat more than 4 egg yolks a week. Try to eat at least 2 vegetarian meals a week. Eat more home-cooked food and  less restaurant, buffet, and fast food. Lifestyle When eating at a restaurant, ask that your food be prepared with less salt or no salt, if possible. If you drink alcohol: Limit how much you use to: 0-1 drink a day for women who are not pregnant. 0-2 drinks a day for men. Be aware of how much alcohol is in your drink. In the [place] one drink equals one 12 oz bottle of beer (355 mL), one 5 oz glass of wine (148 mL), or one 1 oz glass of hard liquor (44 mL). General information Avoid eating more than 2,300 mg of salt a day. If you have hypertension, you may need to reduce your sodium intake to 1,500 mg a day. Work with your health care provider to maintain a healthy body weight or to lose weight. Ask what an ideal weight is for you. Get at least 30 minutes of exercise that causes your heart to beat faster (aerobic exercise) most days of the week. Activities may include walking, swimming, or biking. Work with your health care provider or dietitian to adjust your eating plan to your individual calorie needs. What foods should I eat? Fruits All fresh, dried, or frozen fruit. Canned fruit in natural juice (without added sugar). Vegetables Fresh or frozen vegetables (raw, steamed, roasted, or grilled). Low-sodium or reduced-sodium tomato and vegetable juice. Low-sodium or reduced-sodium tomato sauce and tomato paste. Low-sodium or reduced-sodium canned vegetables. Grains Whole-grain or whole-wheat bread. Whole-grain or whole-wheat pasta. Brown rice. [place]. Bulgur. Whole-grain and low-sodium cereals. Pita bread. Low-fat, low-sodium crackers. Whole-wheat flour tortillas. Meats and other proteins Skinless chicken or [place]. Ground chicken or [place]. Pork with fat trimmed off. Fish and seafood. Egg whites. Dried beans, peas, or lentils. Unsalted nuts, nut butters, and seeds. Unsalted canned beans. Lean cuts of beef with fat trimmed off. Low-sodium, lean precooked or cured meat, such as sausages  or meat loaves. Dairy Low-fat (1%) or fat-free (skim) milk. Reduced-fat, low-fat, or fat-free cheeses. Nonfat, low-sodium ricotta or cottage cheese. Low-fat or nonfat yogurt. Low-fat, low-sodium cheese. Fats and oils Soft margarine without trans fats. Vegetable oil. Reduced-fat, low-fat, or light mayonnaise and salad dressings (reduced-sodium). Canola, safflower, olive, avocado, soybean, and sunflower oils. Avocado. Seasonings and condiments Herbs. Spices. Seasoning mixes without salt. Other foods Unsalted popcorn and pretzels. Fat-free sweets. The items listed above may not be a complete list of foods and beverages you can eat. Contact a dietitian for more information. What foods should I avoid? Fruits Canned fruit in a light or heavy syrup. Fried fruit. Fruit in cream or butter sauce. Vegetables Creamed or fried vegetables. Vegetables in a cheese sauce. Regular canned vegetables (not low-sodium or reduced-sodium). Regular canned tomato sauce and paste (not low-sodium or reduced-sodium). Regular tomato and vegetable juice (not low-sodium or reduced-sodium). Angie Fava. Olives. Grains Baked goods made with fat, such as croissants, muffins, or some breads. Dry pasta or rice meal packs. Meats and other proteins Fatty cuts of meat.  Ribs. [place]ace[place] Pro meat. Berniece Salines. Bologna, salami, and other precooked or cured meats, such as sausages or meat loaves. Fat from the back of a pig (fatback). Bratwurst. Salted nuts and seeds. Canned beans with added salt. Canned or smoked fish. Whole eggs or egg yolks. Chicken or [place] with skin. Dairy Whole or 2% milk, cream, and half-and-half. Whole or full-fat cream cheese. Whole-fat or sweetened yogurt. Full-fat cheese. Nondairy creamers. Whipped toppings. Processed cheese and cheese spreads. Fats and oils Butter. Stick margarine. Lard. Shortening. Ghee. Bacon fat. Tropical oils, such as coconut, palm kernel, or palm oil. Seasonings and condiments Onion salt, garlic salt,  seasoned salt, table salt, and sea salt. Worcestershire sauce. Tartar sauce. Barbecue sauce. Teriyaki sauce. Soy sauce, including reduced-sodium. Steak sauce. Canned and packaged gravies. Fish sauce. Oyster sauce. Cocktail sauce. Store-bought horseradish. Ketchup. Mustard. Meat flavorings and tenderizers. Bouillon cubes. Hot sauces. Pre-made or packaged marinades. Pre-made or packaged taco seasonings. Relishes. Regular salad dressings. Other foods Salted popcorn and pretzels. The items listed above may not be a complete list of foods and beverages you should avoid. Contact a dietitian for more information. Where to find more information National Heart, Lung, and Blood Institute: https://wilson-eaton.com/ American Heart Association: www.heart.org Academy of Nutrition and Dietetics: www.eatright.[place]: www.kidney.org Summary The DASH eating plan is a healthy eating plan that has been shown to reduce high blood pressure (hypertension). It may also reduce your risk for type 2 diabetes, heart disease, and stroke. When on the DASH eating plan, aim to eat more fresh fruits and vegetables, whole grains, lean proteins, low-fat dairy, and heart-healthy fats. With the DASH eating plan, you should limit salt (sodium) intake to 2,300 mg a day. If you have hypertension, you may need to reduce your sodium intake to 1,500 mg a day. Work with your health care provider or dietitian to adjust your eating plan to your individual calorie needs. This information is not intended to replace advice given to you by your health care provider. Make sure you discuss any questions you have with your health care provider. Document Revised: 09/18/2019 Document Reviewed: 09/18/2019 Elsevier Patient Education  2022 [employer].

## 2021-11-06 NOTE — Progress Notes (Signed)
Subjective:   By signing my name below, I, Zite Okoli, attest that this documentation has been prepared under the direction and in the presence of Ann Held, DO. 11/06/2021   Patient ID: Victoria Caldwell, female    DOB: 16-Dec-1965, 56 y.o.   MRN: [id number]  Chief Complaint  Patient presents with   Annual Exam    Pt states fasting     HPI Patient is in today for a comprehensive physical exam.  Her blood pressure was elevated at the beginning of his visit- 140/100 and will be checked again.  She denies fever, hearing loss, ear pain,congestion, sinus pain, sore throat, eye pain, chest pain, palpitations, cough, shortness of breath, wheezing, nausea. vomiting, diarrhea, constipation, blood in stool, dysuria,frequency, hematuria and headaches.   She is UTD on vision care.   She rarely exercises.   She has 2 Covid-19 vaccines and is not interested in getting a booster vaccine. She will receive the flu and tetanus vaccines today.  No changes in family medical history.  She rarely drinks alcohol and does not use drugs. She is not sexually active. She does not use tobacco products.  Past Medical History:  Diagnosis Date   Back pain    Chickenpox    GERD (gastroesophageal reflux disease)    Joint pain    OSA (obstructive sleep apnea) 09/10/2018   Sinus problem     Past Surgical History:  Procedure Laterality Date   BACK SURGERY     LAPAROSCOPIC SUPRACERVICAL HYSTERECTOMY  06/26/2011   Procedure: LAPAROSCOPIC SUPRACERVICAL HYSTERECTOMY;  Surgeon: Logan Bores;  Location: [facility];  Service: Gynecology;  Laterality: N/A;   SALPINGOOPHORECTOMY  06/26/2011   Procedure: SALPINGO OOPHERECTOMY;  Surgeon: Logan Bores;  Location: [facility];  Service: Gynecology;  Laterality: Bilateral;    Family History  Problem Relation Age of Onset   Hypertension Other    Ovarian cysts Other    Cancer Mother 23       ovarian cancer   Heart attack Father 47   Kidney disease  Father    AAA (abdominal aortic aneurysm) Father    Hypertension Father    Sudden death Father    Alcoholism Father    Obesity Father    Colon cancer Neg Hx    Esophageal cancer Neg Hx    Rectal cancer Neg Hx    Stomach cancer Neg Hx     Social History   Socioeconomic History   Marital status: Married    Spouse name: Iran Kievit   Number of children: Not on file   Years of education: Not on file   Highest education level: Not on file  Occupational History   Occupation: Photographer  Tobacco Use   Smoking status: Never   Smokeless tobacco: Never  Substance and Sexual Activity   Alcohol use: Yes    Alcohol/week: 1.0 standard drink    Types: 1 Glasses of wine per week    Comment: rare   Drug use: No   Sexual activity: Not Currently  Other Topics Concern   Not on file  Social History Narrative   Exercise--- no   Social Determinants of Health   Financial Resource Strain: Not on file  Food Insecurity: Not on file  Transportation Needs: Not on file  Physical Activity: Not on file  Stress: Not on file  Social Connections: Not on file  Intimate Partner Violence: Not on file    Outpatient Medications Prior to Visit  Medication Sig Dispense Refill   fexofenadine (ALLEGRA ALLERGY) 180 MG tablet Take 1 tablet (180 mg total) by mouth daily. 30 tablet 0   fluticasone (FLONASE) 50 MCG/ACT nasal spray Place 2 sprays into both nostrils daily. 16 g 5   Multiple Vitamins-Minerals (MULTIVITAMIN ADULT) CHEW Chew 2 each by mouth daily.     famotidine (PEPCID) 40 MG tablet TAKE 1 TABLET(40 MG) BY MOUTH DAILY 90 tablet 1   valACYclovir (VALTREX) 1000 MG tablet Take 1 tablet (1,000 mg total) by mouth 3 (three) times daily. (Patient not taking: Reported on 11/06/2021) 21 tablet 0   Vitamin D, Ergocalciferol, (DRISDOL) 1.25 MG (50000 UT) CAPS capsule Take 1 capsule (50,000 Units total) by mouth every 7 (seven) days. (Patient not taking: Reported on 11/06/2021) 4 capsule  0   No facility-administered medications prior to visit.    No Known Allergies  Review of Systems  Constitutional:  Negative for fever.  HENT:  Negative for congestion, ear pain, hearing loss, sinus pain and sore throat.   Eyes:  Negative for blurred vision and pain.  Respiratory:  Negative for cough, sputum production, shortness of breath and wheezing.   Cardiovascular:  Negative for chest pain and palpitations.  Gastrointestinal:  Negative for blood in stool, constipation, diarrhea, nausea and vomiting.  Genitourinary:  Negative for dysuria, frequency, hematuria and urgency.  Musculoskeletal:  Negative for back pain, falls and myalgias.  Neurological:  Negative for dizziness, sensory change, loss of consciousness, weakness and headaches.  Endo/Heme/Allergies:  Negative for environmental allergies. Does not bruise/bleed easily.  Psychiatric/Behavioral:  Negative for depression and suicidal ideas. The patient is not nervous/anxious and does not have insomnia.       Objective:    Physical Exam Vitals and nursing note reviewed.  Constitutional:      General: She is not in acute distress.    Appearance: Normal appearance. She is well-developed. She is not ill-appearing.  HENT:     Head: Normocephalic and atraumatic.     Right Ear: Tympanic membrane, ear canal and external ear normal.     Left Ear: Tympanic membrane, ear canal and external ear normal.  Eyes:     Extraocular Movements: Extraocular movements intact.     Conjunctiva/sclera: Conjunctivae normal.     Pupils: Pupils are equal, round, and reactive to light.  Neck:     Thyroid: No thyromegaly.     Vascular: No carotid bruit or JVD.  Cardiovascular:     Rate and Rhythm: Normal rate and regular rhythm.     Pulses: Normal pulses.     Heart sounds: Normal heart sounds. No murmur heard.   No gallop.  Pulmonary:     Effort: Pulmonary effort is normal. No respiratory distress.     Breath sounds: Normal breath sounds. No  wheezing, rhonchi or rales.  Chest:     Chest wall: No tenderness.  Abdominal:     General: Bowel sounds are normal. There is no distension.     Palpations: Abdomen is soft. There is no mass.     Tenderness: There is no abdominal tenderness. There is no guarding or rebound.     Hernia: No hernia is present.  Musculoskeletal:        General: Normal range of motion.     Cervical back: Normal range of motion and neck supple.  Lymphadenopathy:     Cervical: No cervical adenopathy.  Skin:    General: Skin is warm and dry.  Neurological:     General:  No focal deficit present.     Mental Status: She is alert and oriented to person, place, and time.  Psychiatric:        Mood and Affect: Mood normal.        Behavior: Behavior normal.        Thought Content: Thought content normal.        Judgment: Judgment normal.    BP 140/90    Pulse 63    Temp 97.6 F (36.4 C) (Oral)    Resp 18    Ht 5\' 4"  (1.626 m)    Wt 212 lb 9.6 oz (96.4 kg)    LMP 05/28/2011    SpO2 99%    BMI 36.49 kg/m  Wt Readings from Last 3 Encounters:  11/06/21 212 lb 9.6 oz (96.4 kg)  08/16/20 199 lb 12.8 oz (90.6 kg)  12/09/18 193 lb 4 oz (87.7 kg)    Diabetic Foot Exam - Simple   No data filed    Lab Results  Component Value Date   WBC 6.5 08/16/2020   HGB 12.6 08/16/2020   HCT 38.6 08/16/2020   PLT 232 08/16/2020   GLUCOSE 111 (H) 08/16/2020   CHOL 193 08/16/2020   TRIG 81 08/16/2020   HDL 67 08/16/2020   LDLCALC 109 (H) 08/16/2020   ALT 15 08/16/2020   AST 16 08/16/2020   NA 139 08/16/2020   K 4.2 08/16/2020   CL 103 08/16/2020   CREATININE 0.87 08/16/2020   BUN 14 08/16/2020   CO2 29 08/16/2020   TSH 2.15 08/16/2020   HGBA1C 5.7 (H) 09/18/2018    Lab Results  Component Value Date   TSH 2.15 08/16/2020   Lab Results  Component Value Date   WBC 6.5 08/16/2020   HGB 12.6 08/16/2020   HCT 38.6 08/16/2020   MCV 86.4 08/16/2020   PLT 232 08/16/2020   Lab Results  Component Value Date    NA 139 08/16/2020   K 4.2 08/16/2020   CO2 29 08/16/2020   GLUCOSE 111 (H) 08/16/2020   BUN 14 08/16/2020   CREATININE 0.87 08/16/2020   BILITOT 0.4 08/16/2020   ALKPHOS 67 09/18/2018   AST 16 08/16/2020   ALT 15 08/16/2020   PROT 7.1 08/16/2020   ALBUMIN 4.7 09/18/2018   CALCIUM 9.3 08/16/2020   GFR 86.90 07/07/2018   Lab Results  Component Value Date   CHOL 193 08/16/2020   Lab Results  Component Value Date   HDL 67 08/16/2020   Lab Results  Component Value Date   LDLCALC 109 (H) 08/16/2020   Lab Results  Component Value Date   TRIG 81 08/16/2020   Lab Results  Component Value Date   CHOLHDL 2.9 08/16/2020   Lab Results  Component Value Date   HGBA1C 5.7 (H) 09/18/2018        Mammogram: Last checked on 10/10/2020. Results were normal. Repeat in 1 year . Due  Colonoscopy: Last completed on 02/27/2016. Colon was normal. Presence of internal hemorrhoids. Repeat in 10 years. Dexa: Last checked on 10/10/2020. Patient is considered osteopenic according to the [facility]. Repeat in 2-3 years.  Assessment & Plan:   Problem List Items Addressed This Visit       Unprioritized   Elevated BP without diagnosis of hypertension    Dash diet  Exercise F/u 2 weeks to recheck       Preventative health care - Primary    ghm utd Check labs  See avs  Relevant Orders   CBC with Differential/Platelet   Comprehensive metabolic panel   Lipid panel   TSH   Other Visit Diagnoses     Need for Tdap vaccination       Relevant Orders   Tdap vaccine greater than or equal to 58yo IM (Completed)   Need for influenza vaccination       Relevant Orders   Flu Vaccine QUAD 82mo+IM (Fluarix, Fluzone & Alfiuria Quad PF) (Completed)        Meds ordered this encounter  Medications   famotidine (PEPCID) 40 MG tablet    Sig: TAKE 1 TABLET(40 MG) BY MOUTH DAILY    Dispense:  90 tablet    Refill:  1    I,Zite Okoli,acting as a Education administrator for [employer], DO.,have documented all relevant documentation on the behalf of Ann Held, DO,as directed by  Ann Held, DO while in the presence of [place], DO. , personally preformed the services described in this documentation.  All medical record entries made by the scribe were at my direction and in my presence.  I have reviewed the chart and discharge instructions (if applicable) and agree that the record reflects my personal performance and is accurate and complete. 11/06/2021

## 2021-11-06 NOTE — Assessment & Plan Note (Signed)
Dash diet  Exercise F/u 2 weeks to recheck

## 2021-11-06 NOTE — Assessment & Plan Note (Signed)
ghm utd Check labs  See avs  

## 2021-11-21 ENCOUNTER — Ambulatory Visit: Payer: Federal, State, Local not specified - PPO | Admitting: Family Medicine

## 2021-11-21 ENCOUNTER — Encounter: Payer: Self-pay | Admitting: Family Medicine

## 2021-11-21 VITALS — BP 138/88 | HR 66 | Temp 98.8°F | Resp 18 | Ht 64.0 in | Wt 213.0 lb

## 2021-11-21 DIAGNOSIS — I10 Essential (primary) hypertension: Secondary | ICD-10-CM | POA: Diagnosis not present

## 2021-11-21 MED ORDER — HYDROCHLOROTHIAZIDE 25 MG PO TABS: 25.0000 mg | ORAL_TABLET | Freq: Every day | ORAL | 3 refills | Status: DC

## 2021-11-21 NOTE — Progress Notes (Signed)
Established Patient Office Visit  Subjective:  Patient ID: Victoria Caldwell, female    DOB: 22-Oct-1966  Age: 56 y.o. MRN: [id number]  CC:  Chief Complaint  Patient presents with   Hypertension   Follow-up    HPI Victoria Caldwell presents for bp check.   She has been following the dash diet but has a very sedentary job. There is a fam hx htn.   Past Medical History:  Diagnosis Date   Back pain    Chickenpox    GERD (gastroesophageal reflux disease)    Joint pain    OSA (obstructive sleep apnea) 09/10/2018   Sinus problem     Past Surgical History:  Procedure Laterality Date   BACK SURGERY     LAPAROSCOPIC SUPRACERVICAL HYSTERECTOMY  06/26/2011   Procedure: LAPAROSCOPIC SUPRACERVICAL HYSTERECTOMY;  Surgeon: Logan Bores;  Location: [facility];  Service: Gynecology;  Laterality: N/A;   SALPINGOOPHORECTOMY  06/26/2011   Procedure: SALPINGO OOPHERECTOMY;  Surgeon: Logan Bores;  Location: [facility];  Service: Gynecology;  Laterality: Bilateral;    Family History  Problem Relation Age of Onset   Hypertension Other    Ovarian cysts Other    Cancer Mother 11       ovarian cancer   Heart attack Father 76   Kidney disease Father    AAA (abdominal aortic aneurysm) Father    Hypertension Father    Sudden death Father    Alcoholism Father    Obesity Father    Colon cancer Neg Hx    Esophageal cancer Neg Hx    Rectal cancer Neg Hx    Stomach cancer Neg Hx     Social History   Socioeconomic History   Marital status: Married    Spouse name: Cannon Quinton   Number of children: Not on file   Years of education: Not on file   Highest education level: Not on file  Occupational History   Occupation: Photographer  Tobacco Use   Smoking status: Never   Smokeless tobacco: Never  Substance and Sexual Activity   Alcohol use: Yes    Alcohol/week: 1.0 standard drink    Types: 1 Glasses of wine per week    Comment: rare   Drug use: No   Sexual  activity: Not Currently  Other Topics Concern   Not on file  Social History Narrative   Exercise--- no   Social Determinants of Health   Financial Resource Strain: Not on file  Food Insecurity: Not on file  Transportation Needs: Not on file  Physical Activity: Not on file  Stress: Not on file  Social Connections: Not on file  Intimate Partner Violence: Not on file    Outpatient Medications Prior to Visit  Medication Sig Dispense Refill   famotidine (PEPCID) 40 MG tablet TAKE 1 TABLET(40 MG) BY MOUTH DAILY 90 tablet 1   fexofenadine (ALLEGRA ALLERGY) 180 MG tablet Take 1 tablet (180 mg total) by mouth daily. 30 tablet 0   fluticasone (FLONASE) 50 MCG/ACT nasal spray Place 2 sprays into both nostrils daily. 16 g 5   Multiple Vitamins-Minerals (MULTIVITAMIN ADULT) CHEW Chew 2 each by mouth daily.     No facility-administered medications prior to visit.    No Known Allergies  ROS Review of Systems  Constitutional:  Negative for activity change, appetite change, fatigue and unexpected weight change.  Respiratory:  Negative for cough and shortness of breath.   Cardiovascular:  Negative for chest pain and palpitations.  Psychiatric/Behavioral:  Negative for behavioral problems and dysphoric mood. The patient is not nervous/anxious.      Objective:    Physical Exam Vitals and nursing note reviewed.  Constitutional:      Appearance: She is well-developed.  HENT:     Head: Normocephalic and atraumatic.  Eyes:     Conjunctiva/sclera: Conjunctivae normal.  Neck:     Thyroid: No thyromegaly.     Vascular: No carotid bruit or JVD.  Cardiovascular:     Rate and Rhythm: Normal rate and regular rhythm.     Heart sounds: Normal heart sounds. No murmur heard. Pulmonary:     Effort: Pulmonary effort is normal. No respiratory distress.     Breath sounds: Normal breath sounds. No wheezing or rales.  Chest:     Chest wall: No tenderness.  Musculoskeletal:     Cervical back: Normal  range of motion and neck supple.     Right lower leg: Edema present.     Left lower leg: Edema present.  Neurological:     Mental Status: She is alert and oriented to person, place, and time.    BP 138/88 (BP Location: Left Arm, Patient Position: Sitting, Cuff Size: Large)    Pulse 66    Temp 98.8 F (37.1 C) (Oral)    Resp 18    Ht 5\' 4"  (1.626 m)    Wt 213 lb (96.6 kg)    LMP 05/28/2011    SpO2 98%    BMI 36.56 kg/m  Wt Readings from Last 3 Encounters:  11/21/21 213 lb (96.6 kg)  11/06/21 212 lb 9.6 oz (96.4 kg)  08/16/20 199 lb 12.8 oz (90.6 kg)     Health Maintenance Due  Topic Date Due   URINE MICROALBUMIN  Never done   MAMMOGRAM  10/10/2021    There are no preventive care reminders to display for this patient.  Lab Results  Component Value Date   TSH 2.81 11/06/2021   Lab Results  Component Value Date   WBC 5.9 11/06/2021   HGB 12.4 11/06/2021   HCT 39.3 11/06/2021   MCV 85.7 11/06/2021   PLT 251.0 11/06/2021   Lab Results  Component Value Date   NA 138 11/06/2021   K 4.2 11/06/2021   CO2 26 11/06/2021   GLUCOSE 86 11/06/2021   BUN 13 11/06/2021   CREATININE 0.72 11/06/2021   BILITOT 0.4 11/06/2021   ALKPHOS 55 11/06/2021   AST 17 11/06/2021   ALT 19 11/06/2021   PROT 7.4 11/06/2021   ALBUMIN 4.5 11/06/2021   CALCIUM 9.4 11/06/2021   GFR 92.81 11/06/2021   Lab Results  Component Value Date   CHOL 183 11/06/2021   Lab Results  Component Value Date   HDL 63.40 11/06/2021   Lab Results  Component Value Date   LDLCALC 103 (H) 11/06/2021   Lab Results  Component Value Date   TRIG 85.0 11/06/2021   Lab Results  Component Value Date   CHOLHDL 3 11/06/2021   Lab Results  Component Value Date   HGBA1C 5.7 (H) 09/18/2018      Assessment & Plan:   Problem List Items Addressed This Visit       Unprioritized   HTN (hypertension) - Primary    Poorly controlled will alter medications, encouraged DASH diet, minimize caffeine and obtain  adequate sleep. Report concerning symptoms and follow up as directed and as needed      Relevant Medications   hydrochlorothiazide (HYDRODIURIL) 25 MG tablet  Meds ordered this encounter  Medications   hydrochlorothiazide (HYDRODIURIL) 25 MG tablet    Sig: Take 1 tablet (25 mg total) by mouth daily.    Dispense:  30 tablet    Refill:  3    Follow-up: Return in about 2 weeks (around 12/05/2021), or if symptoms worsen or fail to improve, for hypertension--- nurse visit ok .    Ann Held, DO

## 2021-11-21 NOTE — Patient Instructions (Signed)

## 2021-11-21 NOTE — Assessment & Plan Note (Signed)
Poorly controlled will alter medications, encouraged DASH diet, minimize caffeine and obtain adequate sleep. Report concerning symptoms and follow up as directed and as needed 

## 2021-12-08 ENCOUNTER — Ambulatory Visit (INDEPENDENT_AMBULATORY_CARE_PROVIDER_SITE_OTHER): Payer: Federal, State, Local not specified - PPO | Admitting: Family Medicine

## 2021-12-08 DIAGNOSIS — I10 Essential (primary) hypertension: Secondary | ICD-10-CM | POA: Diagnosis not present

## 2021-12-08 NOTE — Progress Notes (Signed)
Pt here for Blood pressure check per Dr. Etter Sjogren  Pt currently takes: hydrochlorothiazide 25 mg-Take 1 tablet (25 mg total) by mouth daily.    Pt reports compliance with medication.  BP today @ =128/88 HR =66  Pt advised per (DOD) Dr. Nani Ravens, pt is to continue medication regimen and follow up with pcp

## 2022-02-04 ENCOUNTER — Other Ambulatory Visit: Payer: Self-pay | Admitting: Family Medicine

## 2022-02-22 DIAGNOSIS — J039 Acute tonsillitis, unspecified: Secondary | ICD-10-CM | POA: Diagnosis not present

## 2022-02-22 DIAGNOSIS — R030 Elevated blood-pressure reading, without diagnosis of hypertension: Secondary | ICD-10-CM | POA: Diagnosis not present

## 2022-02-22 DIAGNOSIS — J029 Acute pharyngitis, unspecified: Secondary | ICD-10-CM | POA: Diagnosis not present

## 2022-02-22 DIAGNOSIS — Z131 Encounter for screening for diabetes mellitus: Secondary | ICD-10-CM | POA: Diagnosis not present

## 2022-02-22 DIAGNOSIS — Z6836 Body mass index (BMI) 36.0-36.9, adult: Secondary | ICD-10-CM | POA: Diagnosis not present

## 2022-02-22 DIAGNOSIS — E669 Obesity, unspecified: Secondary | ICD-10-CM | POA: Diagnosis not present

## 2022-03-14 DIAGNOSIS — M722 Plantar fascial fibromatosis: Secondary | ICD-10-CM | POA: Diagnosis not present

## 2022-03-14 DIAGNOSIS — M24571 Contracture, right ankle: Secondary | ICD-10-CM | POA: Diagnosis not present

## 2022-03-14 DIAGNOSIS — M7732 Calcaneal spur, left foot: Secondary | ICD-10-CM | POA: Diagnosis not present

## 2022-04-12 DIAGNOSIS — M24572 Contracture, left ankle: Secondary | ICD-10-CM | POA: Diagnosis not present

## 2022-05-17 DIAGNOSIS — M722 Plantar fascial fibromatosis: Secondary | ICD-10-CM | POA: Diagnosis not present

## 2022-06-08 IMAGING — MG DIGITAL SCREENING BILAT W/ TOMO W/ CAD
6 of 10 series · 6 of 30 positions shown · non-contrast
Comparison: Previous exam(s).

CLINICAL DATA: Screening.

EXAM:
DIGITAL SCREENING BILATERAL MAMMOGRAM WITH TOMO AND CAD

[L MLO synth-2D]
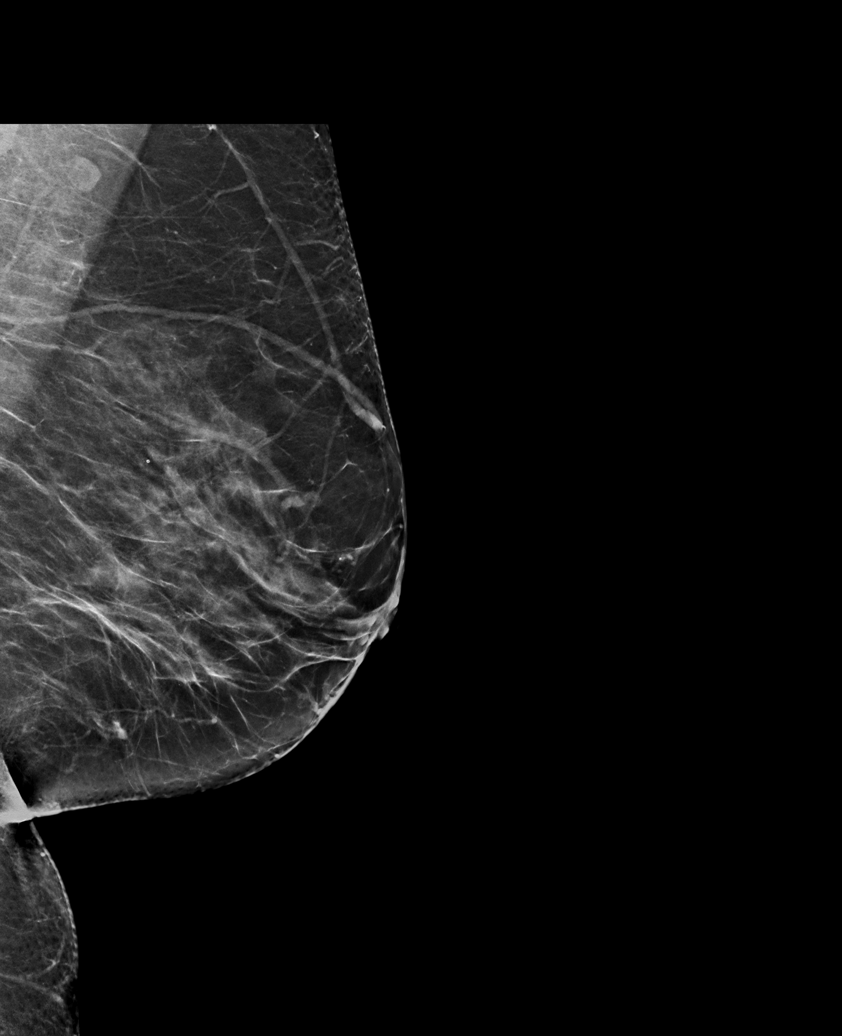

[L CC synth-2D]
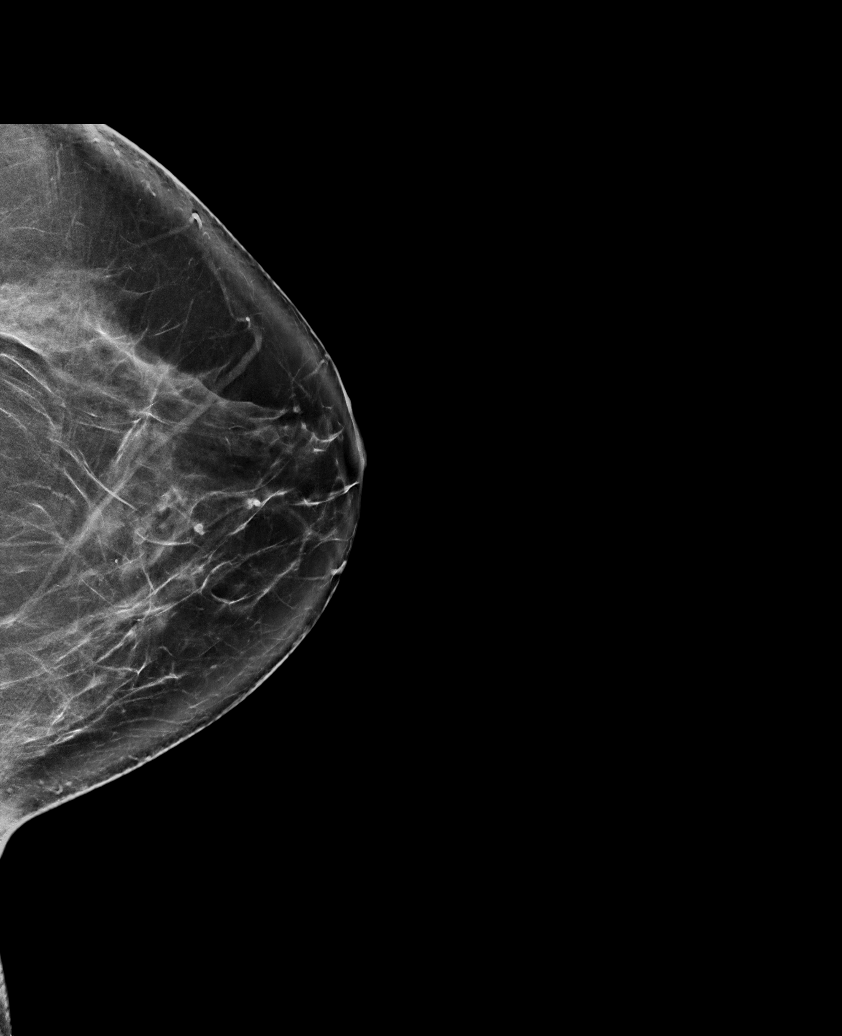

[L XCCL synth-2D]
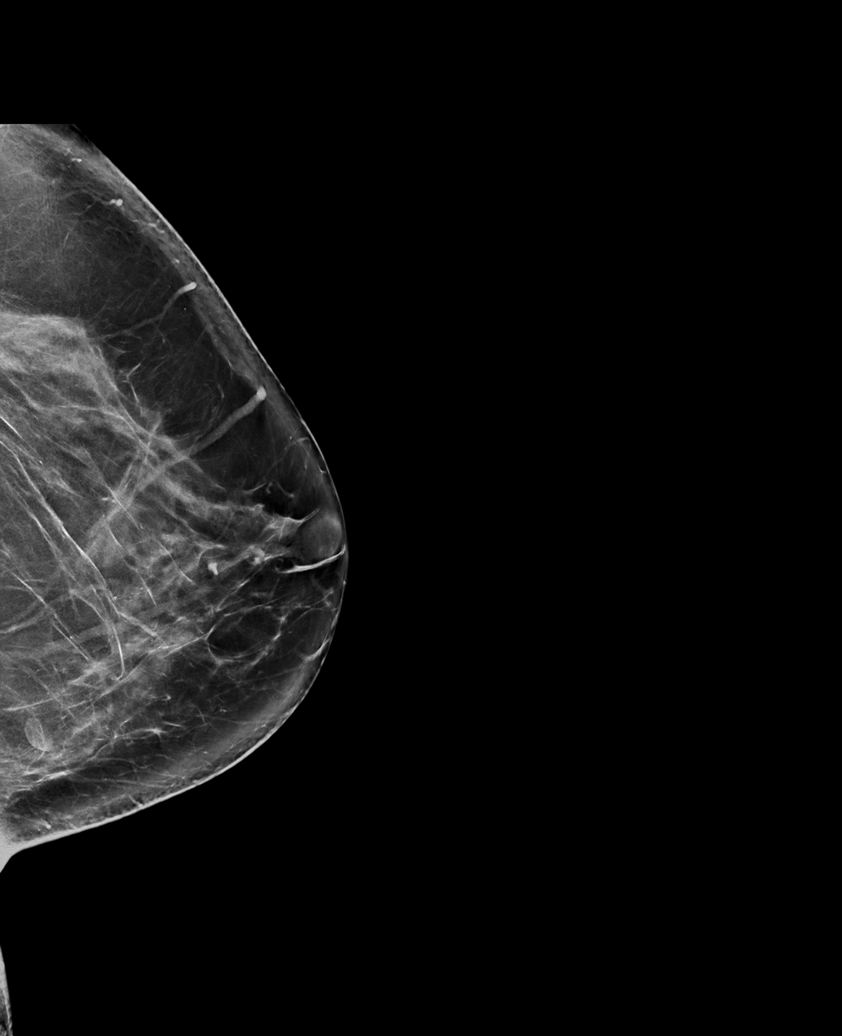

[R CC synth-2D]
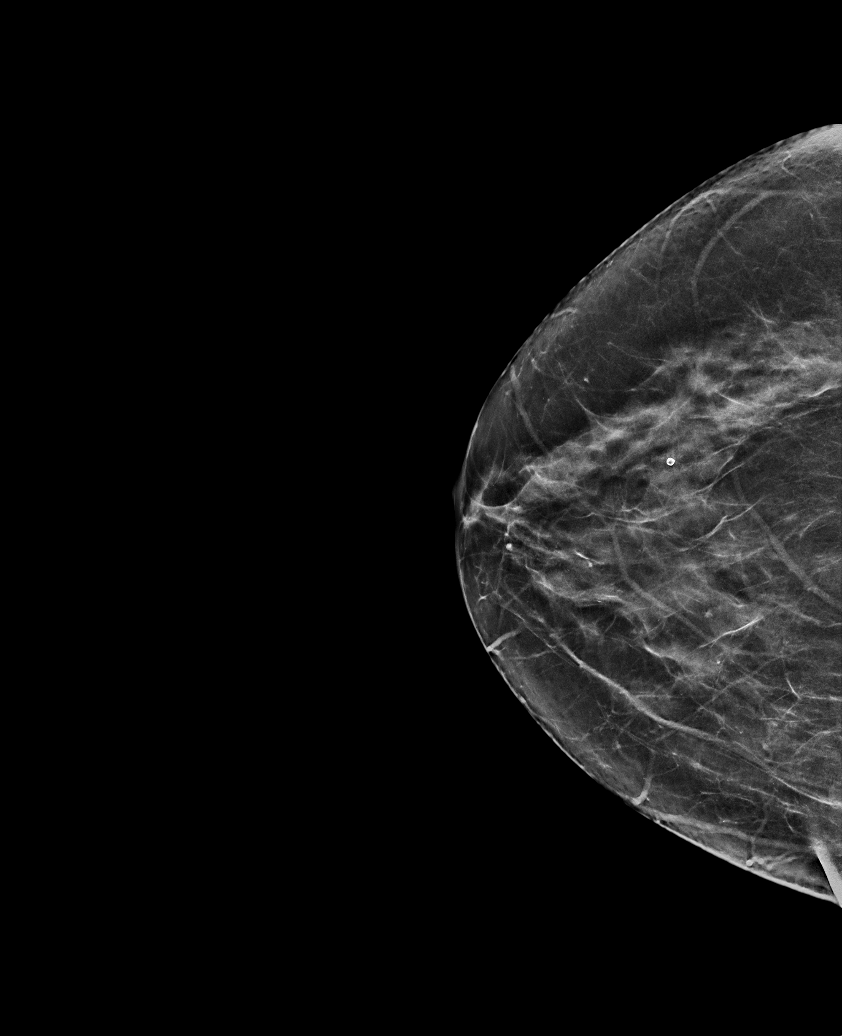

[R MLO synth-2D]
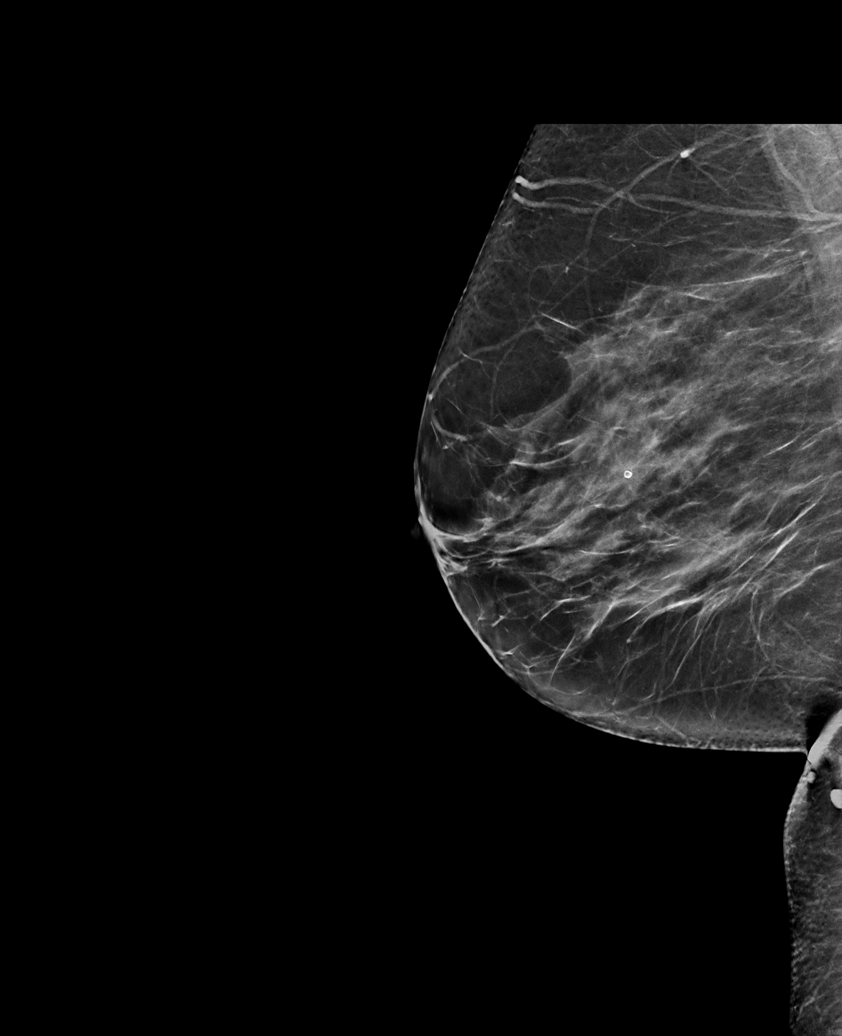

[R MLO tomo · tomo slice 36/71.0]
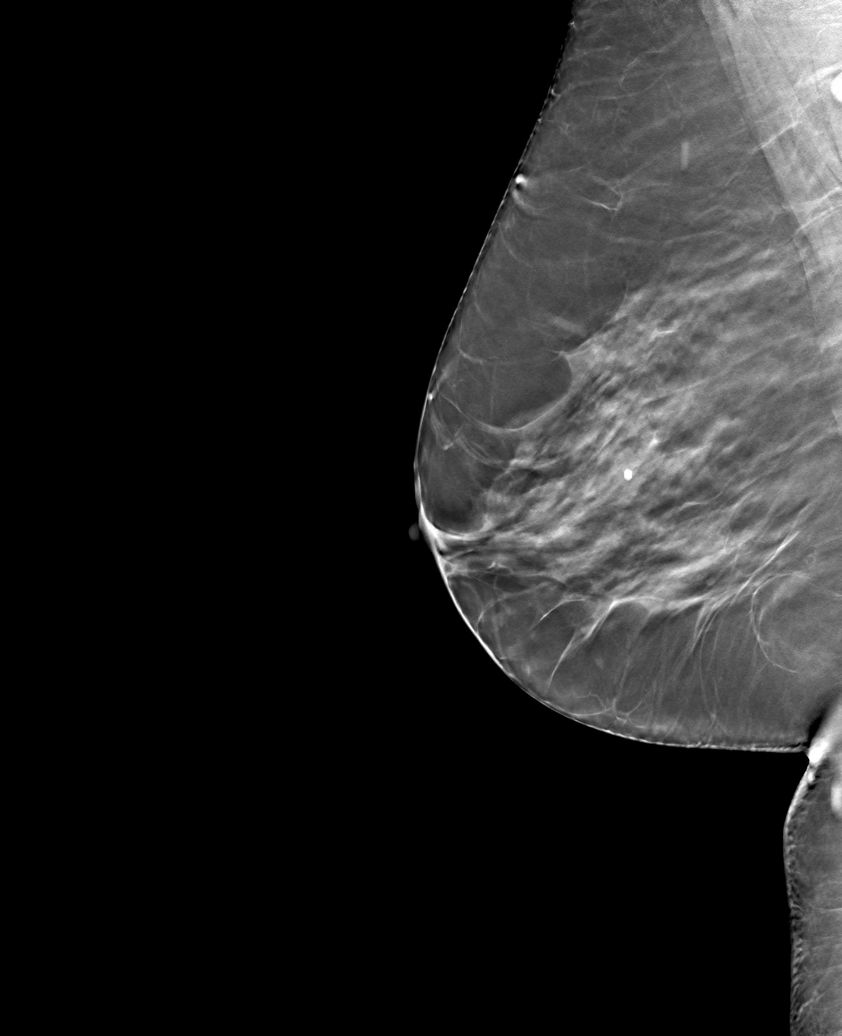

[6 of 30 positions shown; findings below may reference images not displayed]

ACR Breast Density Category c: The breast tissue is heterogeneously
dense, which may obscure small masses.
FINDINGS: There are no findings suspicious for malignancy. Images were
processed with CAD.
IMPRESSION: No mammographic evidence of malignancy. A result letter of this
screening mammogram will be mailed directly to the patient.

RECOMMENDATION:
Screening mammogram in one year. (Code:FT-U-LHB)

BI-RADS CATEGORY  1: Negative.

## 2022-06-19 DIAGNOSIS — M722 Plantar fascial fibromatosis: Secondary | ICD-10-CM | POA: Diagnosis not present

## 2022-06-26 ENCOUNTER — Other Ambulatory Visit (HOSPITAL_BASED_OUTPATIENT_CLINIC_OR_DEPARTMENT_OTHER): Payer: Self-pay | Admitting: Family Medicine

## 2022-06-26 DIAGNOSIS — Z1231 Encounter for screening mammogram for malignant neoplasm of breast: Secondary | ICD-10-CM

## 2022-07-04 ENCOUNTER — Encounter: Payer: Self-pay | Admitting: Family Medicine

## 2022-07-04 NOTE — Telephone Encounter (Signed)
Okay to place new referral? 

## 2022-07-05 NOTE — Telephone Encounter (Signed)
Pt called back regarding sleep study. Please advise.

## 2022-07-10 ENCOUNTER — Ambulatory Visit (HOSPITAL_BASED_OUTPATIENT_CLINIC_OR_DEPARTMENT_OTHER)
Admission: RE | Admit: 2022-07-10 | Discharge: 2022-07-10 | Disposition: A | Discharge status: Home / Self Care | Payer: Federal, State, Local not specified - PPO | Source: Ambulatory Visit | Attending: Family Medicine | Admitting: Family Medicine

## 2022-07-10 ENCOUNTER — Encounter (HOSPITAL_BASED_OUTPATIENT_CLINIC_OR_DEPARTMENT_OTHER): Payer: Self-pay

## 2022-07-10 DIAGNOSIS — Z1231 Encounter for screening mammogram for malignant neoplasm of breast: Secondary | ICD-10-CM | POA: Diagnosis not present

## 2022-07-18 ENCOUNTER — Institutional Professional Consult (permissible substitution): Payer: Federal, State, Local not specified - PPO | Admitting: Nurse Practitioner

## 2022-07-25 ENCOUNTER — Ambulatory Visit (INDEPENDENT_AMBULATORY_CARE_PROVIDER_SITE_OTHER): Payer: Federal, State, Local not specified - PPO | Admitting: Nurse Practitioner

## 2022-07-25 ENCOUNTER — Encounter: Payer: Self-pay | Admitting: Nurse Practitioner

## 2022-07-25 VITALS — BP 136/84 | HR 67 | Temp 98.4°F | Ht 64.0 in | Wt 216.4 lb

## 2022-07-25 DIAGNOSIS — E669 Obesity, unspecified: Secondary | ICD-10-CM

## 2022-07-25 DIAGNOSIS — G4733 Obstructive sleep apnea (adult) (pediatric): Secondary | ICD-10-CM

## 2022-07-25 NOTE — Assessment & Plan Note (Signed)
Mild OSA with AHI 13.8 on HST from 2019. She tried oral appliance with little success. Never been on CPAP. She has had worsening symptoms over the past few years. Would like to try CPAP now. Orders sent for new start CPAP 5-15 cmH2O, mask of choice and heated humidification. We did discuss that insurance may request new sleep study but given her symptoms and weight, likelihood of her still having sleep apnea is high so I will move forward with trying to get her CPAP. We discussed how untreated sleep apnea puts an individual at risk for cardiac arrhthymias, pulm HTN, DM, stroke and increases their risk for daytime accidents. We also briefly reviewed treatment options including weight loss, side sleeping position, oral appliance, CPAP therapy or referral to ENT for possible surgical options. Cautioned to avoid drowsy driving.   Patient Instructions  Given your symptoms and your history, I am concerned that you still have sleep disordered breathing with obstructive sleep apnea which is contributing to your daytime symptoms. We will start you on CPAP therapy.   Start CPAP therapy 5-15cmH2O every night with mask of choice and heated humidification, minimum of 4-6 hours a night.  Change equipment every 30 days or as directed by DME. Wash your tubing with warm soap and water daily, hang to dry. Wash humidifier portion weekly.  Be aware of reduced alertness and do not drive or operate heavy machinery if experiencing this or drowsiness.  Exercise encouraged, as tolerated. Notify if persistent daytime sleepiness occurs even with consistent use of CPAP.  Follow up in 8 weeks with Victoria Jersey Martavious Hartel,NP or sooner if needed

## 2022-07-25 NOTE — Assessment & Plan Note (Signed)
BMI 37. Healthy weight loss encouraged 

## 2022-07-25 NOTE — Patient Instructions (Signed)
Given your symptoms and your history, I am concerned that you still have sleep disordered breathing with obstructive sleep apnea which is contributing to your daytime symptoms. We will start you on CPAP therapy.   Start CPAP therapy 5-15cmH2O every night with mask of choice and heated humidification, minimum of 4-6 hours a night.  Change equipment every 30 days or as directed by DME. Wash your tubing with warm soap and water daily, hang to dry. Wash humidifier portion weekly.  Be aware of reduced alertness and do not drive or operate heavy machinery if experiencing this or drowsiness.  Exercise encouraged, as tolerated. Notify if persistent daytime sleepiness occurs even with consistent use of CPAP.  Follow up in 8 weeks with Joellen Jersey Kaylanie Capili,NP or sooner if needed

## 2022-07-25 NOTE — Progress Notes (Signed)
$'@Patient'e$  ID: Victoria Caldwell, female    DOB: [birth year], 56 y.o.   MRN: [id number]  Chief Complaint  Patient presents with   Consult    Pt states she was a PT in 2019 and tried Othadonics and it did not work. Pt states she is snoring more and unrested    Referring provider: Ann Held, *  HPI: 56 year old female, never smoker followed for mild OSA.  She is a patient of Dr. Juanetta Gosling and last seen in office 09/10/2018.  Past medical history significant for hypertension, allergic rhinitis.  TEST/EVENTS:  09/08/2018 HST: AHI 13.8, SPO2 low 88%  07/25/2022: Today-sleep consult Patient presents today to reestablish care for sleep apnea.  Last time she was seen was 2019.  She was tried on an oral appliance to treat mild OSA.  She has tried this numerous times since then but feels like it does not work well for her.  She also felt like it caused some issues with her teeth.  She is back today because she is still struggling with significant daytime fatigue symptoms.  She also has loud snoring at night and wakes up in the morning feeling poorly rested.  She also struggles with mouth breathing at night and wakes up with a dry mouth.  Denies any morning headaches, sleep parasomnia/paralysis, drowsy driving, symptoms of narcolepsy/cataplexy. She typically goes to bed between 9 to 10 PM.  Falls asleep within 5 minutes.  Wakes 1-2 times a night.  Officially gets out of bed in the morning around 445 to 5 AM.  Does not take anything to help her fall asleep at night or drink caffeine throughout the day to help keep her awake.  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  She is not exactly sure how much but has struggled with weight loss and decreased metabolism.  No significant cardiac history or previous stroke.  She drinks 1 alcoholic beverage a week.  She is never smoker.  Lives at home with her spouse.  [place]  4  No Known Allergies  Immunization History  Administered Date(s) Administered   Influenza Split 08/28/2018   Influenza,inj,Quad PF,6+ Mos 11/06/2021   Influenza-Unspecified 07/30/2015   Moderna Sars-Covid-2 Vaccination 01/27/2020, 02/24/2020   Tdap 10/29/2010, 11/06/2021   Zoster Recombinat (Shingrix) 07/07/2018, 09/16/2018    Past Medical History:  Diagnosis Date   Back pain    Chickenpox    GERD (gastroesophageal reflux disease)    Joint pain    OSA (obstructive sleep apnea) 09/10/2018   Sinus problem     Tobacco History: Social History   Tobacco Use  Smoking Status Never   Passive exposure: Never  Smokeless Tobacco Never   Counseling given: Not Answered   Outpatient Medications Prior to Visit  Medication Sig Dispense Refill   famotidine (PEPCID) 40 MG tablet TAKE 1 TABLET(40 MG) BY MOUTH DAILY 90 tablet 1   fexofenadine (ALLEGRA ALLERGY) 180 MG tablet Take 1 tablet (180 mg total) by mouth daily. 30 tablet 0   Multiple Vitamins-Minerals (MULTIVITAMIN ADULT) CHEW Chew 2 each by mouth daily.     fluticasone (FLONASE) 50 MCG/ACT nasal spray Place 2 sprays into both nostrils daily. 16 g 5   hydrochlorothiazide (HYDRODIURIL) 25 MG tablet Take 1 tablet (25 mg total) by mouth daily. 30 tablet 3   No facility-administered medications prior to visit.     Review of Systems:  Constitutional: No night sweats, fevers, chills, r lassitude. +excessive daytime fatigue, weight gain HEENT: No headaches, difficulty swallowing, tooth/dental problems, or sore throat. No sneezing, itching, ear ache. +occasional nasal congestion, post nasal drip CV:  No chest pain, orthopnea, PND, swelling in lower extremities, anasarca, dizziness, palpitations, syncope Resp: No shortness of breath with exertion or at rest. No excess mucus or change in color of mucus. No productive or non-productive. No hemoptysis. No wheezing.  No chest wall deformity GI:  No heartburn, indigestion, abdominal pain,  nausea, vomiting, diarrhea, change in bowel habits, loss of appetite, bloody stools.  GU: No dysuria, change in color of urine, urgency or frequency.  No flank pain, no hematuria  Skin: No rash, lesions, ulcerations MSK:  No joint pain or swelling.  No decreased range of motion.  No back pain. Neuro: No dizziness or lightheadedness.  Psych: No depression or anxiety. Mood stable.     Physical Exam:  BP 136/84 (BP Location: Right Leg, Patient Position: Sitting, Cuff Size: Large)   Pulse 67   Temp 98.4 F (36.9 C) (Oral)   Ht '5\' 4"'$  (1.626 m)   Wt 216 lb 6.4 oz (98.2 kg)   LMP 05/28/2011   SpO2 98%   BMI 37.14 kg/m   GEN: Pleasant, interactive, well-appearing; obese; in no acute distress HEENT:  Normocephalic and atraumatic. PERRLA. Sclera white. Nasal turbinates pink, moist and patent bilaterally. No rhinorrhea present. Oropharynx pink and moist, without exudate or edema. No lesions, ulcerations, or postnasal drip. Mallampati 3, enlarged tonsils (2+) NECK:  Supple w/ fair ROM. No JVD present. Normal carotid impulses w/o bruits. Thyroid symmetrical with no goiter or nodules palpated. No lymphadenopathy.   CV: RRR, no m/r/g, no peripheral edema. Pulses intact, +2 bilaterally. No cyanosis, pallor or clubbing. PULMONARY:  Unlabored, regular breathing. Clear bilaterally A&P w/o wheezes/rales/rhonchi. No accessory muscle use.  GI: BS present and normoactive. Soft, non-tender to palpation. No organomegaly or masses detected.  MSK: No erythema, warmth or tenderness. Cap refil <2 sec all extrem. No deformities or joint swelling noted.  Neuro: A/Ox3. No focal deficits noted.   Skin: Warm, no lesions or rashe Psych: Normal affect and behavior. Judgement and thought content appropriate.     Lab Results:  CBC    Component Value Date/Time   WBC 5.9 11/06/2021 1034   RBC 4.59 11/06/2021 1034   HGB 12.4 11/06/2021 1034   HGB 12.3 09/18/2018 0957   HCT 39.3 11/06/2021 1034   HCT 38.2  09/18/2018 0957   PLT 251.0 11/06/2021 1034   MCV 85.7 11/06/2021 1034   MCV 85 09/18/2018 0957   MCH 28.2 08/16/2020 1508   MCHC 31.6 11/06/2021 1034   RDW 15.1 11/06/2021 1034   RDW 14.9 09/18/2018 0957   LYMPHSABS 2.1 11/06/2021 1034   LYMPHSABS 1.9 09/18/2018 0957   MONOABS 0.4 11/06/2021 1034   EOSABS 0.1 11/06/2021 1034   EOSABS 0.1 09/18/2018 0957   BASOSABS 0.0 11/06/2021 1034   BASOSABS 0.0 09/18/2018 0957    BMET    Component Value Date/Time   NA 138 11/06/2021 1034   NA 141 09/18/2018 0957   K 4.2 11/06/2021 1034   CL 105 11/06/2021 1034   CO2 26 11/06/2021 1034   GLUCOSE 86 11/06/2021 1034   BUN 13 11/06/2021 1034   BUN 11 09/18/2018 0957   CREATININE 0.72 11/06/2021 1034   CREATININE 0.87 08/16/2020 1508   CALCIUM 9.4 11/06/2021 1034   GFRNONAA 80 09/18/2018 0957   GFRAA 92 09/18/2018  0957    BNP No results found for: "BNP"   Imaging:  MM 3D SCREEN BREAST BILATERAL  Result Date: 07/11/2022 CLINICAL DATA:  Screening. EXAM: DIGITAL SCREENING BILATERAL MAMMOGRAM WITH TOMOSYNTHESIS AND CAD TECHNIQUE: Bilateral screening digital craniocaudal and mediolateral oblique mammograms were obtained. Bilateral screening digital breast tomosynthesis was performed. The images were evaluated with computer-aided detection. COMPARISON:  Previous exam(s). ACR Breast Density Category b: There are scattered areas of fibroglandular density. FINDINGS: There are no findings suspicious for malignancy. IMPRESSION: No mammographic evidence of malignancy. A result letter of this screening mammogram will be mailed directly to the patient. RECOMMENDATION: Screening mammogram in one year. (Code:SM-B-01Y) BI-RADS CATEGORY  1: Negative. Electronically Signed   By: Abelardo Diesel M.D.   On: 07/11/2022 14:23          No data to display          No results found for: "NITRICOXIDE"      Assessment & Plan:   OSA (obstructive sleep apnea) Mild OSA with AHI 13.8 on HST from 2019.  She tried oral appliance with little success. Never been on CPAP. She has had worsening symptoms over the past few years. Would like to try CPAP now. Orders sent for new start CPAP 5-15 cmH2O, mask of choice and heated humidification. We did discuss that insurance may request new sleep study but given her symptoms and weight, likelihood of her still having sleep apnea is high so I will move forward with trying to get her CPAP. We discussed how untreated sleep apnea puts an individual at risk for cardiac arrhthymias, pulm HTN, DM, stroke and increases their risk for daytime accidents. We also briefly reviewed treatment options including weight loss, side sleeping position, oral appliance, CPAP therapy or referral to ENT for possible surgical options. Cautioned to avoid drowsy driving.   Patient Instructions  Given your symptoms and your history, I am concerned that you still have sleep disordered breathing with obstructive sleep apnea which is contributing to your daytime symptoms. We will start you on CPAP therapy.   Start CPAP therapy 5-15cmH2O every night with mask of choice and heated humidification, minimum of 4-6 hours a night.  Change equipment every 30 days or as directed by DME. Wash your tubing with warm soap and water daily, hang to dry. Wash humidifier portion weekly.  Be aware of reduced alertness and do not drive or operate heavy machinery if experiencing this or drowsiness.  Exercise encouraged, as tolerated. Notify if persistent daytime sleepiness occurs even with consistent use of CPAP.  Follow up in 8 weeks with Joellen Jersey Morena Mckissack,NP or sooner if needed     Obesity (BMI 30-39.9) BMI 37. Healthy weight loss encouraged.    I spent 38 minutes of dedicated to the care of this patient on the date of this encounter to include pre-visit review of records, face-to-face time with the patient discussing conditions above, post visit ordering of testing, clinical documentation with the electronic  health record, making appropriate referrals as documented, and communicating necessary findings to members of the patients care team.  Clayton Bibles, NP 07/25/2022  Pt aware and understands NP's role.

## 2022-07-26 NOTE — Progress Notes (Signed)
Reviewed and agree with assessment/plan.   Chesley Mires, MD [facility] Pulmonary/Critical Care 07/26/2022, 8:39 AM Pager:  [phone]

## 2022-07-31 DIAGNOSIS — M722 Plantar fascial fibromatosis: Secondary | ICD-10-CM | POA: Diagnosis not present

## 2022-08-16 ENCOUNTER — Telehealth: Payer: Self-pay | Admitting: *Deleted

## 2022-08-16 NOTE — Telephone Encounter (Signed)
Faxed sleep study to [facility]. Informed patient that I was going to fax sleep study to her DME for her CPAP. She verbalized understanding. Nothing further needed

## 2022-08-16 NOTE — Telephone Encounter (Signed)
Patient called and stated that her [employer] will not approve for Cpap machine without a home sleep study. Would like to get a home sleep study ordered

## 2022-09-01 ENCOUNTER — Ambulatory Visit
Admission: RE | Admit: 2022-09-01 | Discharge: 2022-09-01 | Disposition: A | Discharge status: Home / Self Care | Payer: Federal, State, Local not specified - PPO | Source: Ambulatory Visit

## 2022-09-01 VITALS — BP 148/95 | HR 74 | Temp 98.6°F | Resp 18

## 2022-09-01 DIAGNOSIS — H1032 Unspecified acute conjunctivitis, left eye: Secondary | ICD-10-CM | POA: Diagnosis not present

## 2022-09-01 DIAGNOSIS — J0100 Acute maxillary sinusitis, unspecified: Secondary | ICD-10-CM

## 2022-09-01 MED ORDER — AMOXICILLIN-POT CLAVULANATE 875-125 MG PO TABS: 1.0000 | ORAL_TABLET | Freq: Two times a day (BID) | ORAL | 0 refills | Status: DC

## 2022-09-01 MED ORDER — POLYMYXIN B-TRIMETHOPRIM 10000-0.1 UNIT/ML-% OP SOLN: 1.0000 [drp] | OPHTHALMIC | 0 refills | Status: AC

## 2022-09-01 NOTE — ED Triage Notes (Signed)
The patient states yesterday she had redness, itchiness, and discomfort to her left eye. The patient states this morning she has drainage to the left eye and had sinus pressure.   Home interventions: Allergra,OTC allergy eye drops

## 2022-09-01 NOTE — ED Provider Notes (Signed)
[facility]    CSN: [id number] Arrival date & time: 09/01/22  0840      History   Chief Complaint Chief Complaint  Patient presents with   Eye Problem    Eye pain redness. Overnite discharge..blurry vision. Throat pain - Entered by patient   Facial Pain    HPI Victoria Caldwell is a 56 y.o. female.   Patient here today for evaluation of redness and itchiness to her left eye.  She reports she does have some associated pain as well.  She notes that she has also developed significant sinus pressure.  She does have allergies at baseline and has been taking allergy medication however states sinus pressure is worsening.  She denies any fevers.  She has not had any nausea or vomiting.  The history is provided by the patient.  Eye Problem Associated symptoms: discharge and redness   Associated symptoms: no headaches, no nausea and no vomiting     Past Medical History:  Diagnosis Date   Back pain    Chickenpox    GERD (gastroesophageal reflux disease)    Joint pain    OSA (obstructive sleep apnea) 09/10/2018   Sinus problem     Patient Active Problem List   Diagnosis Date Noted   Obesity (BMI 30-39.9) 07/25/2022   Preventative health care 11/06/2021   HTN (hypertension) 11/06/2021   Suspected COVID-19 virus infection 10/07/2019   Rhinitis 09/18/2018   OSA (obstructive sleep apnea) 09/10/2018   Atypical squamous cells of undetermined significance (ASCUS) on Papanicolaou smear of cervix 01/26/2016    Past Surgical History:  Procedure Laterality Date   BACK SURGERY     LAPAROSCOPIC SUPRACERVICAL HYSTERECTOMY  06/26/2011   Procedure: LAPAROSCOPIC SUPRACERVICAL HYSTERECTOMY;  Surgeon: Logan Bores;  Location: [facility];  Service: Gynecology;  Laterality: N/A;   SALPINGOOPHORECTOMY  06/26/2011   Procedure: SALPINGO OOPHERECTOMY;  Surgeon: Logan Bores;  Location: [facility];  Service: Gynecology;  Laterality: Bilateral;    OB History     Gravida  2   Para   2   Term  2   Preterm      AB      Living  2      SAB      IAB      Ectopic      Multiple      Live Births               Home Medications    Prior to Admission medications   Medication Sig Start Date End Date Taking? Authorizing Provider  amoxicillin-clavulanate (AUGMENTIN) 875-125 MG tablet Take 1 tablet by mouth every 12 (twelve) hours. 09/01/22  Yes Francene Finders, PA-C  fluticasone (VERAMYST) 27.5 MCG/SPRAY nasal spray Place 2 sprays into the nose daily.   Yes [provider]  trimethoprim-polymyxin b (POLYTRIM) ophthalmic solution Place 1 drop into the left eye every 4 (four) hours for 7 days. 09/01/22 09/08/22 Yes Francene Finders, PA-C  famotidine (PEPCID) 40 MG tablet TAKE 1 TABLET(40 MG) BY MOUTH DAILY 02/05/22   [name], Alferd Apa, DO  fexofenadine (ALLEGRA ALLERGY) 180 MG tablet Take 1 tablet (180 mg total) by mouth daily. 08/16/20   Ann Held, DO  Multiple Vitamins-Minerals (MULTIVITAMIN ADULT) CHEW Chew 2 each by mouth daily.    [provider]    Family History Family History  Problem Relation Age of Onset   Hypertension Other    Ovarian cysts Other  Cancer Mother 4       ovarian cancer   Heart attack Father 60   Kidney disease Father    AAA (abdominal aortic aneurysm) Father    Hypertension Father    Sudden death Father    Alcoholism Father    Obesity Father    Colon cancer Neg Hx    Esophageal cancer Neg Hx    Rectal cancer Neg Hx    Stomach cancer Neg Hx     Social History Social History   Tobacco Use   Smoking status: Never    Passive exposure: Never   Smokeless tobacco: Never  Substance Use Topics   Alcohol use: Yes    Alcohol/week: 1.0 standard drink of alcohol    Types: 1 Glasses of wine per week    Comment: rare   Drug use: No     Allergies   Patient has no known allergies.   Review of Systems Review of Systems  Constitutional:  Negative for chills and fever.  HENT:  Positive  for congestion and sinus pressure. Negative for rhinorrhea.   Eyes:  Positive for discharge and redness.  Respiratory:  Negative for cough and shortness of breath.   Gastrointestinal:  Negative for nausea and vomiting.  Neurological:  Negative for headaches.     Physical Exam Triage Vital Signs ED Triage Vitals  Enc Vitals Group     BP      Pulse      Resp      Temp      Temp src      SpO2      Weight      Height      Head Circumference      Peak Flow      Pain Score      Pain Loc      Pain Edu?      Excl. in [place]?    No data found.  Updated Vital Signs BP (!) 148/95 (BP Location: Right Arm)   Pulse 74   Temp 98.6 F (37 C) (Oral)   Resp 18   LMP 05/28/2011   SpO2 98%      Physical Exam Vitals and nursing note reviewed.  Constitutional:      General: She is not in acute distress.    Appearance: Normal appearance. She is not ill-appearing.  HENT:     Head: Normocephalic and atraumatic.     Nose: Congestion present.  Eyes:     Extraocular Movements: Extraocular movements intact.     Pupils: Pupils are equal, round, and reactive to light.     Comments: Left conjunctiva injected  Cardiovascular:     Rate and Rhythm: Normal rate.  Pulmonary:     Effort: Pulmonary effort is normal.  Neurological:     Mental Status: She is alert.  Psychiatric:        Mood and Affect: Mood normal.        Behavior: Behavior normal.        Thought Content: Thought content normal.      UC Treatments / Results  Labs (all labs ordered are listed, but only abnormal results are displayed) Labs Reviewed - No data to display  EKG   Radiology No results found.  Procedures Procedures (including critical care time)  Medications Ordered in UC Medications - No data to display  Initial Impression / Assessment and Plan / UC Course  I have reviewed the triage vital signs and the nursing notes.  Pertinent labs & imaging results that were available during my care of the patient  were reviewed by me and considered in my medical decision making (see chart for details).    We will treat to cover for sinusitis as well as conjunctivitis.  Augmentin and Polytrim drops prescribed.  Encouraged follow-up if no gradual improvement or with any further concerns.  Patient expresses understanding.  Final Clinical Impressions(s) / UC Diagnoses   Final diagnoses:  Acute maxillary sinusitis, recurrence not specified  Acute conjunctivitis of left eye, unspecified acute conjunctivitis type   Discharge Instructions   None    ED Prescriptions     Medication Sig Dispense Auth. Provider   amoxicillin-clavulanate (AUGMENTIN) 875-125 MG tablet Take 1 tablet by mouth every 12 (twelve) hours. 14 tablet Ewell Poe F, PA-C   trimethoprim-polymyxin b (POLYTRIM) ophthalmic solution Place 1 drop into the left eye every 4 (four) hours for 7 days. 10 mL Francene Finders, PA-C      PDMP not reviewed this encounter.   Francene Finders, PA-C 09/01/22 [phone]

## 2022-09-03 DIAGNOSIS — H2513 Age-related nuclear cataract, bilateral: Secondary | ICD-10-CM | POA: Diagnosis not present

## 2022-09-03 DIAGNOSIS — H18832 Recurrent erosion of cornea, left eye: Secondary | ICD-10-CM | POA: Diagnosis not present

## 2022-09-03 DIAGNOSIS — H18232 Secondary corneal edema, left eye: Secondary | ICD-10-CM | POA: Diagnosis not present

## 2022-09-26 ENCOUNTER — Ambulatory Visit (INDEPENDENT_AMBULATORY_CARE_PROVIDER_SITE_OTHER): Payer: Federal, State, Local not specified - PPO | Admitting: Nurse Practitioner

## 2022-09-26 ENCOUNTER — Encounter: Payer: Self-pay | Admitting: Nurse Practitioner

## 2022-09-26 VITALS — BP 138/90 | HR 88 | Ht 64.0 in | Wt 217.6 lb

## 2022-09-26 DIAGNOSIS — G4733 Obstructive sleep apnea (adult) (pediatric): Secondary | ICD-10-CM | POA: Diagnosis not present

## 2022-09-26 DIAGNOSIS — E669 Obesity, unspecified: Secondary | ICD-10-CM

## 2022-09-26 NOTE — Progress Notes (Unsigned)
$'@Patient'L$  ID: Victoria Caldwell, female    DOB: 1966-06-21, 56 y.o.   MRN: [id number]  Chief Complaint  Patient presents with   Follow-up    Not on CPAP [facility] is stating pt needs a new sleep study    Referring provider: [name], Alferd Apa, *  HPI: 56 year old female, never smoker followed for mild OSA.  She is a patient of Dr. Juanetta Gosling and last seen in office 07/25/2022 by [facility] NP.  Past medical history significant for hypertension, allergic rhinitis.  TEST/EVENTS:  09/08/2018 HST: AHI 13.8, SPO2 low 88%  07/25/2022: OV with Madiha Bambrick NP to reestablish care for sleep apnea.  Last time she was seen was 2019.  She was tried on an oral appliance to treat mild OSA.  She has tried this numerous times since then but feels like it does not work well for her.  She also felt like it caused some issues with her teeth.  She is back today because she is still struggling with significant daytime fatigue symptoms.  She also has loud snoring at night and wakes up in the morning feeling poorly rested.  She also struggles with mouth breathing at night and wakes up with a dry mouth.  Denies any morning headaches, sleep parasomnia/paralysis, drowsy driving, symptoms of narcolepsy/cataplexy. She typically goes to bed between 9 to 10 PM.  Falls asleep within 5 minutes.  Wakes 1-2 times a night.  Officially gets out of bed in the morning around 445 to 5 AM.  Does not take anything to help her fall asleep at night or drink caffeine throughout the day to help keep her awake.  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  She is not exactly sure how much but has struggled with weight loss and decreased metabolism.  No significant cardiac history or previous stroke.  She drinks 1 alcoholic beverage a week.  She is never smoker.  Lives at home with her spouse. Discussed that she may need a repeat sleep study but we will try to use her  previous study since she never started on CPAP therapy. Orders placed for CPAP.   09/27/2022: Today - follow up Patient presents today for follow up. At our last visit, we ordered CPAP therapy based on 2019 sleep study results with mildly moderate OSA. Unfortunately, she never got started on this. She was told by [facility] that she would need a new sleep study. She tells me she called the office a few times but was advised to wait until her follow up. She is understandably very frustrated. She's been struggling with increased weight over the past 2 years. She is very tired throughout the day. Feels like she doesn't sleep well at night. She has loud snoring at night. Is very hopeful that CPAP will help her and wants to get started as soon as possible. Denies sleep parasomnias/paralysis, drowsy driving.   No Known Allergies  Immunization History  Administered Date(s) Administered   Influenza Split 08/28/2018   Influenza,inj,Quad PF,6+ Mos 11/06/2021   Influenza-Unspecified 07/30/2015   Moderna Sars-Covid-2 Vaccination 01/27/2020, 02/24/2020   Tdap 10/29/2010, 11/06/2021   Zoster Recombinat (Shingrix) 07/07/2018, 09/16/2018    Past Medical History:  Diagnosis Date   Back pain    Chickenpox    GERD (gastroesophageal reflux disease)    Joint pain    OSA (obstructive sleep apnea) 09/10/2018   Sinus problem  Tobacco History: Social History   Tobacco Use  Smoking Status Never   Passive exposure: Never  Smokeless Tobacco Never   Counseling given: Not Answered   Outpatient Medications Prior to Visit  Medication Sig Dispense Refill   amoxicillin-clavulanate (AUGMENTIN) 875-125 MG tablet Take 1 tablet by mouth every 12 (twelve) hours. 14 tablet 0   famotidine (PEPCID) 40 MG tablet TAKE 1 TABLET(40 MG) BY MOUTH DAILY 90 tablet 1   fexofenadine (ALLEGRA ALLERGY) 180 MG tablet Take 1 tablet (180 mg total) by mouth daily. 30 tablet 0   fluticasone (VERAMYST) 27.5 MCG/SPRAY nasal spray  Place 2 sprays into the nose daily.     Multiple Vitamins-Minerals (MULTIVITAMIN ADULT) CHEW Chew 2 each by mouth daily.     No facility-administered medications prior to visit.     Review of Systems:   Constitutional: No night sweats, fevers, chills, r lassitude. +excessive daytime fatigue, weight gain HEENT: No headaches, difficulty swallowing, tooth/dental problems, or sore throat. No sneezing, itching, ear ache. +occasional nasal congestion, post nasal drip CV:  No chest pain, orthopnea, PND, swelling in lower extremities, anasarca, dizziness, palpitations, syncope Resp: No shortness of breath with exertion or at rest. No excess mucus or change in color of mucus. No productive or non-productive. No hemoptysis. No wheezing.  No chest wall deformity GI:  No heartburn, indigestion, abdominal pain, nausea, vomiting, diarrhea, change in bowel habits, loss of appetite, bloody stools.  GU: No dysuria, change in color of urine, urgency or frequency.  No flank pain, no hematuria  Skin: No rash, lesions, ulcerations MSK:  No joint pain or swelling.  No decreased range of motion.  No back pain. Neuro: No dizziness or lightheadedness.  Psych: No depression or anxiety. Mood stable.     Physical Exam:  BP (!) 138/90 (BP Location: Right Arm, Patient Position: Sitting, Cuff Size: Normal)   Pulse 88   Ht '5\' 4"'$  (1.626 m)   Wt 217 lb 9.6 oz (98.7 kg)   LMP 05/28/2011   SpO2 98%   BMI 37.35 kg/m   GEN: Pleasant, interactive, well-appearing; obese; in no acute distress HEENT:  Normocephalic and atraumatic. PERRLA. Sclera white. Nasal turbinates pink, moist and patent bilaterally. No rhinorrhea present. Oropharynx pink and moist, without exudate or edema. No lesions, ulcerations, or postnasal drip. Mallampati 3, enlarged tonsils (2+) NECK:  Supple w/ fair ROM. No JVD present. Normal carotid impulses w/o bruits. Thyroid symmetrical with no goiter or nodules palpated. No lymphadenopathy.   CV: RRR,  no m/r/g, no peripheral edema. Pulses intact, +2 bilaterally. No cyanosis, pallor or clubbing. PULMONARY:  Unlabored, regular breathing. Clear bilaterally A&P w/o wheezes/rales/rhonchi. No accessory muscle use.  GI: BS present and normoactive. Soft, non-tender to palpation. No organomegaly or masses detected.  MSK: No erythema, warmth or tenderness. Cap refil <2 sec all extrem. No deformities or joint swelling noted.  Neuro: A/Ox3. No focal deficits noted.   Skin: Warm, no lesions or rashe Psych: Normal affect and behavior. Judgement and thought content appropriate.     Lab Results:  CBC    Component Value Date/Time   WBC 5.9 11/06/2021 1034   RBC 4.59 11/06/2021 1034   HGB 12.4 11/06/2021 1034   HGB 12.3 09/18/2018 0957   HCT 39.3 11/06/2021 1034   HCT 38.2 09/18/2018 0957   PLT 251.0 11/06/2021 1034   MCV 85.7 11/06/2021 1034   MCV 85 09/18/2018 0957   MCH 28.2 08/16/2020 1508   MCHC 31.6 11/06/2021 1034   RDW 15.1  11/06/2021 1034   RDW 14.9 09/18/2018 0957   LYMPHSABS 2.1 11/06/2021 1034   LYMPHSABS 1.9 09/18/2018 0957   MONOABS 0.4 11/06/2021 1034   EOSABS 0.1 11/06/2021 1034   EOSABS 0.1 09/18/2018 0957   BASOSABS 0.0 11/06/2021 1034   BASOSABS 0.0 09/18/2018 0957    BMET    Component Value Date/Time   NA 138 11/06/2021 1034   NA 141 09/18/2018 0957   K 4.2 11/06/2021 1034   CL 105 11/06/2021 1034   CO2 26 11/06/2021 1034   GLUCOSE 86 11/06/2021 1034   BUN 13 11/06/2021 1034   BUN 11 09/18/2018 0957   CREATININE 0.72 11/06/2021 1034   CREATININE 0.87 08/16/2020 1508   CALCIUM 9.4 11/06/2021 1034   GFRNONAA 80 09/18/2018 0957   GFRAA 92 09/18/2018 0957    BNP No results found for: "BNP"   Imaging:  No results found.        No data to display          No results found for: "NITRICOXIDE"      Assessment & Plan:   OSA (obstructive sleep apnea) History of mild to moderate OSA. Tried on oral appliance but symptoms have remain poorly  controlled. She has had weight gain since with worsening daytime fatigue and restless sleep. Attempted to start her on CPAP at our last OV. She was told by DME company she will need a repeat study given time since her last. Unfortunately, this was never communicated to me. I have ordered an urgent home sleep study today.  We discussed how untreated sleep apnea puts an individual at risk for cardiac arrhthymias, pulm HTN, DM, stroke and increases their risk for daytime accidents. We also briefly reviewed treatment options including weight loss, side sleeping position, oral appliance, CPAP therapy or referral to ENT for possible surgical options. Cautioned to avoid drowsy driving.    Patient Instructions  Given your symptoms and your history, I am concerned that you still have sleep disordered breathing with obstructive sleep apnea which is contributing to your daytime symptoms. I have ordered a home sleep study for further evaluation - someone will contact you for scheduling.  We discussed how untreated sleep apnea puts an individual at risk for cardiac arrhthymias, pulm HTN, DM, stroke and increases their risk for daytime accidents. We also briefly reviewed treatment options including weight loss, side sleeping position, oral appliance, CPAP therapy or referral to ENT for possible surgical options. Use caution when driving and pull over if needed   Follow up in 12 weeks with Joellen Jersey Evalette Montrose,NP or sooner if needed    Obesity (BMI 30-39.9) BMI 37. Healthy weight loss encouraged.     I spent 32 minutes of dedicated to the care of this patient on the date of this encounter to include pre-visit review of records, face-to-face time with the patient discussing conditions above, post visit ordering of testing, clinical documentation with the electronic health record, making appropriate referrals as documented, and communicating necessary findings to members of the patients care team.  Clayton Bibles,  NP 09/27/2022  Pt aware and understands NP's role.

## 2022-09-26 NOTE — Patient Instructions (Signed)
Given your symptoms and your history, I am concerned that you still have sleep disordered breathing with obstructive sleep apnea which is contributing to your daytime symptoms. I have ordered a home sleep study for further evaluation - someone will contact you for scheduling.  We discussed how untreated sleep apnea puts an individual at risk for cardiac arrhthymias, pulm HTN, DM, stroke and increases their risk for daytime accidents. We also briefly reviewed treatment options including weight loss, side sleeping position, oral appliance, CPAP therapy or referral to ENT for possible surgical options. Use caution when driving and pull over if needed   Follow up in 12 weeks with Victoria Jersey Aristotelis Vilardi,NP or sooner if needed

## 2022-09-27 ENCOUNTER — Encounter: Payer: Self-pay | Admitting: Nurse Practitioner

## 2022-09-27 NOTE — Assessment & Plan Note (Signed)
BMI 37. Healthy weight loss encouraged 

## 2022-09-27 NOTE — Progress Notes (Signed)
Reviewed and agree with assessment/plan.   Chesley Mires, MD [facility] Pulmonary/Critical Care 09/27/2022, 2:12 PM Pager:  [phone]

## 2022-09-27 NOTE — Assessment & Plan Note (Signed)
History of mild to moderate OSA. Tried on oral appliance but symptoms have remain poorly controlled. She has had weight gain since with worsening daytime fatigue and restless sleep. Attempted to start her on CPAP at our last OV. She was told by DME company she will need a repeat study given time since her last. Unfortunately, this was never communicated to me. I have ordered an urgent home sleep study today.  We discussed how untreated sleep apnea puts an individual at risk for cardiac arrhthymias, pulm HTN, DM, stroke and increases their risk for daytime accidents. We also briefly reviewed treatment options including weight loss, side sleeping position, oral appliance, CPAP therapy or referral to ENT for possible surgical options. Cautioned to avoid drowsy driving.    Patient Instructions  Given your symptoms and your history, I am concerned that you still have sleep disordered breathing with obstructive sleep apnea which is contributing to your daytime symptoms. I have ordered a home sleep study for further evaluation - someone will contact you for scheduling.  We discussed how untreated sleep apnea puts an individual at risk for cardiac arrhthymias, pulm HTN, DM, stroke and increases their risk for daytime accidents. We also briefly reviewed treatment options including weight loss, side sleeping position, oral appliance, CPAP therapy or referral to ENT for possible surgical options. Use caution when driving and pull over if needed   Follow up in 12 weeks with Victoria Jersey Mattisen Pohlmann,NP or sooner if needed

## 2022-10-12 ENCOUNTER — Ambulatory Visit: Payer: Federal, State, Local not specified - PPO | Admitting: Nurse Practitioner

## 2022-10-12 DIAGNOSIS — G4733 Obstructive sleep apnea (adult) (pediatric): Secondary | ICD-10-CM

## 2022-10-31 DIAGNOSIS — G4733 Obstructive sleep apnea (adult) (pediatric): Secondary | ICD-10-CM | POA: Diagnosis not present

## 2022-11-01 ENCOUNTER — Ambulatory Visit: Payer: Federal, State, Local not specified - PPO | Admitting: Nurse Practitioner

## 2022-11-05 NOTE — Progress Notes (Signed)
Holly, please see above. Routed to incorrect person.

## 2022-11-05 NOTE — Progress Notes (Signed)
Please notify patient that I received her HST results today. They showed severe OSA 47.7/hr, SpO2 low 78%. Please send orders for urgent new start CPAP therapy 5-15 cmH2O, mask of choice and heated humidification. Reschedule her f/u to 12 weeks from now to see how CPAP is going. Thanks.

## 2022-11-08 ENCOUNTER — Ambulatory Visit: Payer: Federal, State, Local not specified - PPO | Admitting: Nurse Practitioner

## 2022-11-08 ENCOUNTER — Other Ambulatory Visit: Payer: Self-pay

## 2022-11-08 DIAGNOSIS — G4733 Obstructive sleep apnea (adult) (pediatric): Secondary | ICD-10-CM

## 2022-11-13 ENCOUNTER — Telehealth: Payer: Self-pay | Admitting: Nurse Practitioner

## 2022-11-13 NOTE — Telephone Encounter (Signed)
Called and spoke to patient and she states that she needs to CPAP order hat was placed on January 11,2024 to go [place]. She states that she keeps calling [facility] and they do not have the order yet.   Please advise PCCs.  I told her that I woulsd send a message to state that she needs the order to go to [facility]  Thank you

## 2022-11-13 NOTE — Telephone Encounter (Signed)
Thank you for the update. I called patient back to give her the update that order for her cpap was faxed over to [facility]. And that I would give it a few days for the order to go through and to be processed. She verbalized understanding. Nothing further needed

## 2022-11-13 NOTE — Telephone Encounter (Signed)
Faxed order to [facility] not sure what happen

## 2022-12-10 DIAGNOSIS — G4733 Obstructive sleep apnea (adult) (pediatric): Secondary | ICD-10-CM | POA: Diagnosis not present

## 2022-12-24 DIAGNOSIS — H2513 Age-related nuclear cataract, bilateral: Secondary | ICD-10-CM | POA: Diagnosis not present

## 2022-12-24 DIAGNOSIS — H16223 Keratoconjunctivitis sicca, not specified as Sjogren's, bilateral: Secondary | ICD-10-CM | POA: Diagnosis not present

## 2022-12-24 DIAGNOSIS — H18832 Recurrent erosion of cornea, left eye: Secondary | ICD-10-CM | POA: Diagnosis not present

## 2022-12-27 ENCOUNTER — Ambulatory Visit: Payer: Federal, State, Local not specified - PPO | Admitting: Nurse Practitioner

## 2023-01-08 DIAGNOSIS — G4733 Obstructive sleep apnea (adult) (pediatric): Secondary | ICD-10-CM | POA: Diagnosis not present

## 2023-01-31 ENCOUNTER — Encounter: Payer: Self-pay | Admitting: Nurse Practitioner

## 2023-01-31 ENCOUNTER — Ambulatory Visit: Payer: Federal, State, Local not specified - PPO | Admitting: Nurse Practitioner

## 2023-01-31 VITALS — BP 138/88 | HR 61 | Ht 64.0 in | Wt 220.0 lb

## 2023-01-31 DIAGNOSIS — G4733 Obstructive sleep apnea (adult) (pediatric): Secondary | ICD-10-CM | POA: Diagnosis not present

## 2023-01-31 DIAGNOSIS — E669 Obesity, unspecified: Secondary | ICD-10-CM | POA: Diagnosis not present

## 2023-01-31 NOTE — Progress Notes (Signed)
@Patient  ID: Victoria Caldwell, female    DOB: 1966/02/22, 57 y.o.   MRN: [id number]  Chief Complaint  Patient presents with   Follow-up    OSA on CPAP    Referring provider: [name], Alferd Apa, *  HPI: 57 year old female, never smoker followed for mild OSA.  She is a patient of Dr. Juanetta Gosling and last seen in office 10/12/2022 by Belenda Cruise NP.  Past medical history significant for hypertension, allergic rhinitis.  TEST/EVENTS:  09/08/2018 HST: AHI 13.8, SPO2 low 88% 10/12/2022 HST: AHI 47.7/h, SpO2 low 78%  07/25/2022: OV with Carron Jaggi NP to reestablish care for sleep apnea.  Last time she was seen was 2019.  She was tried on an oral appliance to treat mild OSA.  She has tried this numerous times since then but feels like it does not work well for her.  She also felt like it caused some issues with her teeth.  She is back today because she is still struggling with significant daytime fatigue symptoms.  She also has loud snoring at night and wakes up in the morning feeling poorly rested.  She also struggles with mouth breathing at night and wakes up with a dry mouth.  Denies any morning headaches, sleep parasomnia/paralysis, drowsy driving, symptoms of narcolepsy/cataplexy. She typically goes to bed between 9 to 10 PM.  Falls asleep within 5 minutes.  Wakes 1-2 times a night.  Officially gets out of bed in the morning around 445 to 5 AM.  Does not take anything to help her fall asleep at night or drink caffeine throughout the day to help keep her awake.  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  She is not exactly sure how much but has struggled with weight loss and decreased metabolism.  No significant cardiac history or previous stroke.  She drinks 1 alcoholic beverage a week.  She is never smoker.  Lives at home with her spouse. Discussed that she may need a repeat sleep study but we will try to use her previous  study since she never started on CPAP therapy. Orders placed for CPAP.   09/27/2022: Ov with Fran Neiswonger NP for follow up. At our last visit, we ordered CPAP therapy based on 2019 sleep study results with mildly moderate OSA. Unfortunately, she never got started on this. She was told by [facility] that she would need a new sleep study. She tells me she called the office a few times but was advised to wait until her follow up. She is understandably very frustrated. She's been struggling with increased weight over the past 2 years. She is very tired throughout the day. Feels like she doesn't sleep well at night. She has loud snoring at night. Is very hopeful that CPAP will help her and wants to get started as soon as possible. Denies sleep parasomnias/paralysis, drowsy driving.   01/31/2023: Today - follow up Patient presents today for follow up after being started on CPAP therapy. She has been doing quite well with this. She feels like she's sleeping better and she's waking up feeling more refreshed. Her husband doesn't notice her snoring with the CPAP. She feels like her pressures are set appropriately. Doesn't notice much leakage. Daytime fatigue has improved. Denies any drowsy driving or morning headaches.   01/01/2023-01/30/2023:  CPAP 5-15 cmH2O 29/30 days; 87% >4 hr; av use 6 hr 15 min Pressure 95th 14.8  cmH2O, max 15 Leaks 95th 6.2 AHI 2.2  No Known Allergies  Immunization History  Administered Date(s) Administered   Influenza Split 08/28/2018   Influenza,inj,Quad PF,6+ Mos 11/06/2021   Influenza-Unspecified 07/30/2015   Moderna Sars-Covid-2 Vaccination 01/27/2020, 02/24/2020   Tdap 10/29/2010, 11/06/2021   Zoster Recombinat (Shingrix) 07/07/2018, 09/16/2018    Past Medical History:  Diagnosis Date   Back pain    Chickenpox    GERD (gastroesophageal reflux disease)    Joint pain    OSA (obstructive sleep apnea) 09/10/2018   Sinus problem     Tobacco History: Social History   Tobacco Use   Smoking Status Never   Passive exposure: Never  Smokeless Tobacco Never   Counseling given: Not Answered   Outpatient Medications Prior to Visit  Medication Sig Dispense Refill   famotidine (PEPCID) 40 MG tablet TAKE 1 TABLET(40 MG) BY MOUTH DAILY 90 tablet 1   fexofenadine (ALLEGRA ALLERGY) 180 MG tablet Take 1 tablet (180 mg total) by mouth daily. 30 tablet 0   fluticasone (VERAMYST) 27.5 MCG/SPRAY nasal spray Place 2 sprays into the nose daily.     Multiple Vitamins-Minerals (MULTIVITAMIN ADULT) CHEW Chew 2 each by mouth daily.     amoxicillin-clavulanate (AUGMENTIN) 875-125 MG tablet Take 1 tablet by mouth every 12 (twelve) hours. (Patient not taking: Reported on 01/31/2023) 14 tablet 0   No facility-administered medications prior to visit.     Review of Systems:   Constitutional: No weight loss or gain, night sweats, fevers, chills, lassitude. +daytime fatigue (improving) HEENT: No headaches, difficulty swallowing, tooth/dental problems, or sore throat. No sneezing, itching, ear ache. +occasional nasal congestion, post nasal drip CV:  No chest pain, orthopnea, PND, swelling in lower extremities, anasarca, dizziness, palpitations, syncope Resp: +snoring (resolved with CPAP). No shortness of breath with exertion or at rest. No excess mucus or change in color of mucus. No productive or non-productive. No hemoptysis. No wheezing.  No chest wall deformity GI:  No heartburn, indigestion, abdominal pain, nausea, vomiting, diarrhea, change in bowel habits, loss of appetite, bloody stools.  GU: No dysuria, change in color of urine, urgency or frequency.   Skin: No rash, lesions, ulcerations MSK:  No joint pain or swelling.  Neuro: No dizziness or lightheadedness.  Psych: No depression or anxiety. Mood stable.     Physical Exam:  BP 138/88 (BP Location: Left Arm, Patient Position: Sitting, Cuff Size: Large)   Pulse 61   Ht 5\' 4"  (1.626 m)   Wt 220 lb (99.8 kg)   LMP 05/28/2011    SpO2 99%   BMI 37.76 kg/m   GEN: Pleasant, interactive, well-appearing; obese; in no acute distress HEENT:  Normocephalic and atraumatic. PERRLA. Sclera white. Nasal turbinates pink, moist and patent bilaterally. No rhinorrhea present. Oropharynx pink and moist, without exudate or edema. No lesions, ulcerations, or postnasal drip. Mallampati 3, enlarged tonsils (2+) NECK:  Supple w/ fair ROM. No JVD present. Normal carotid impulses w/o bruits. Thyroid symmetrical with no goiter or nodules palpated. No lymphadenopathy.   CV: RRR, no m/r/g, no peripheral edema. Pulses intact, +2 bilaterally. No cyanosis, pallor or clubbing. PULMONARY:  Unlabored, regular breathing. Clear bilaterally A&P w/o wheezes/rales/rhonchi. No accessory muscle use.  GI: BS present and normoactive. Soft, non-tender to palpation. No organomegaly or masses detected.  MSK: No erythema, warmth or tenderness. Cap refil <2 sec all extrem. No deformities or joint swelling noted.  Neuro: A/Ox3. No focal deficits noted.   Skin: Warm, no lesions or rashe Psych: Normal  affect and behavior. Judgement and thought content appropriate.     Lab Results:  CBC    Component Value Date/Time   WBC 5.9 11/06/2021 1034   RBC 4.59 11/06/2021 1034   HGB 12.4 11/06/2021 1034   HGB 12.3 09/18/2018 0957   HCT 39.3 11/06/2021 1034   HCT 38.2 09/18/2018 0957   PLT 251.0 11/06/2021 1034   MCV 85.7 11/06/2021 1034   MCV 85 09/18/2018 0957   MCH 28.2 08/16/2020 1508   MCHC 31.6 11/06/2021 1034   RDW 15.1 11/06/2021 1034   RDW 14.9 09/18/2018 0957   LYMPHSABS 2.1 11/06/2021 1034   LYMPHSABS 1.9 09/18/2018 0957   MONOABS 0.4 11/06/2021 1034   EOSABS 0.1 11/06/2021 1034   EOSABS 0.1 09/18/2018 0957   BASOSABS 0.0 11/06/2021 1034   BASOSABS 0.0 09/18/2018 0957    BMET    Component Value Date/Time   NA 138 11/06/2021 1034   NA 141 09/18/2018 0957   K 4.2 11/06/2021 1034   CL 105 11/06/2021 1034   CO2 26 11/06/2021 1034   GLUCOSE 86  11/06/2021 1034   BUN 13 11/06/2021 1034   BUN 11 09/18/2018 0957   CREATININE 0.72 11/06/2021 1034   CREATININE 0.87 08/16/2020 1508   CALCIUM 9.4 11/06/2021 1034   GFRNONAA 80 09/18/2018 0957   GFRAA 92 09/18/2018 0957    BNP No results found for: "BNP"   Imaging:  No results found.        No data to display          No results found for: "NITRICOXIDE"      Assessment & Plan:   OSA (obstructive sleep apnea) Severe OSA, on CPAP therapy. She has good compliance and is receiving good benefit from use. She is utilizing her maximize pressure most nights. We will adjust her from 5-15 to 8-18 cmH2O. Advised her to monitor and let me know if pressures seem to high with this change. Otherwise, she is doing quite well. Cautioned on safe driving practices. Understands risks of untreated OSA.  Patient Instructions  Continue to use CPAP every night, minimum of 4-6 hours a night.  Change equipment every 30 days or as directed by DME. Wash your tubing with warm soap and water daily, hang to dry. Wash humidifier portion weekly.  Be aware of reduced alertness and do not drive or operate heavy machinery if experiencing this or drowsiness.  Exercise encouraged, as tolerated. Notify if persistent daytime sleepiness occurs even with consistent use of CPAP.  We discussed how untreated sleep apnea puts an individual at risk for cardiac arrhthymias, pulm HTN, DM, stroke and increases their risk for daytime accidents. We also briefly reviewed treatment options including weight loss, side sleeping position, oral appliance, CPAP therapy or referral to ENT for possible surgical options  Follow up in 6 months with Dr. Halford Chessman. If symptoms worsen, please contact office for sooner follow up    Obesity (BMI 30-39.9) BMI 37. Healthy weight loss encouraged.     I spent 32 minutes of dedicated to the care of this patient on the date of this encounter to include pre-visit review of records,  face-to-face time with the patient discussing conditions above, post visit ordering of testing, clinical documentation with the electronic health record, making appropriate referrals as documented, and communicating necessary findings to members of the patients care team.  Clayton Bibles, NP 01/31/2023  Pt aware and understands NP's role.

## 2023-01-31 NOTE — Assessment & Plan Note (Signed)
Severe OSA, on CPAP therapy. She has good compliance and is receiving good benefit from use. She is utilizing her maximize pressure most nights. We will adjust her from 5-15 to 8-18 cmH2O. Advised her to monitor and let me know if pressures seem to high with this change. Otherwise, she is doing quite well. Cautioned on safe driving practices. Understands risks of untreated OSA.  Patient Instructions  Continue to use CPAP every night, minimum of 4-6 hours a night.  Change equipment every 30 days or as directed by DME. Wash your tubing with warm soap and water daily, hang to dry. Wash humidifier portion weekly.  Be aware of reduced alertness and do not drive or operate heavy machinery if experiencing this or drowsiness.  Exercise encouraged, as tolerated. Notify if persistent daytime sleepiness occurs even with consistent use of CPAP.  We discussed how untreated sleep apnea puts an individual at risk for cardiac arrhthymias, pulm HTN, DM, stroke and increases their risk for daytime accidents. We also briefly reviewed treatment options including weight loss, side sleeping position, oral appliance, CPAP therapy or referral to ENT for possible surgical options  Follow up in 6 months with Dr. Halford Chessman. If symptoms worsen, please contact office for sooner follow up

## 2023-01-31 NOTE — Patient Instructions (Signed)
Continue to use CPAP every night, minimum of 4-6 hours a night.  Change equipment every 30 days or as directed by DME. Wash your tubing with warm soap and water daily, hang to dry. Wash humidifier portion weekly.  Be aware of reduced alertness and do not drive or operate heavy machinery if experiencing this or drowsiness.  Exercise encouraged, as tolerated. Notify if persistent daytime sleepiness occurs even with consistent use of CPAP.  We discussed how untreated sleep apnea puts an individual at risk for cardiac arrhthymias, pulm HTN, DM, stroke and increases their risk for daytime accidents. We also briefly reviewed treatment options including weight loss, side sleeping position, oral appliance, CPAP therapy or referral to ENT for possible surgical options  Follow up in 6 months with Dr. Halford Chessman. If symptoms worsen, please contact office for sooner follow up

## 2023-01-31 NOTE — Assessment & Plan Note (Signed)
BMI 37. Healthy weight loss encouraged.  

## 2023-02-03 NOTE — Progress Notes (Signed)
Reviewed and agree with assessment/plan.   Davia Smyre, MD [facility] 02/03/2023, 9:56 AM Pager:  [phone]  

## 2023-02-08 DIAGNOSIS — G4733 Obstructive sleep apnea (adult) (pediatric): Secondary | ICD-10-CM | POA: Diagnosis not present

## 2023-03-10 DIAGNOSIS — G4733 Obstructive sleep apnea (adult) (pediatric): Secondary | ICD-10-CM | POA: Diagnosis not present

## 2023-03-26 ENCOUNTER — Telehealth: Payer: Self-pay | Admitting: Nurse Practitioner

## 2023-03-26 NOTE — Telephone Encounter (Signed)
Pt states insurance needs a reason as to why they were charged an amount of money. [employer] is requesting for supporting medical records relating to the service on 16/07/9603 and a certificate of medical necessity.

## 2023-03-29 NOTE — Telephone Encounter (Signed)
Called pt, no answer. Unfortunately I do not see any services for this pt on 12/10/22. I do not see any labs, imagining or procedures. I have attempted to contact pt to clarify exactly what day she could have been referring to. I have left vmm for pt to give the office a call back.

## 2023-07-16 ENCOUNTER — Other Ambulatory Visit (HOSPITAL_BASED_OUTPATIENT_CLINIC_OR_DEPARTMENT_OTHER): Payer: Self-pay | Admitting: Family Medicine

## 2023-07-16 DIAGNOSIS — Z1231 Encounter for screening mammogram for malignant neoplasm of breast: Secondary | ICD-10-CM

## 2023-07-22 ENCOUNTER — Ambulatory Visit (HOSPITAL_BASED_OUTPATIENT_CLINIC_OR_DEPARTMENT_OTHER)
Admission: RE | Admit: 2023-07-22 | Discharge: 2023-07-22 | Disposition: A | Discharge status: Home / Self Care | Payer: Federal, State, Local not specified - PPO | Source: Ambulatory Visit | Attending: Family Medicine | Admitting: Family Medicine

## 2023-07-22 ENCOUNTER — Encounter (HOSPITAL_BASED_OUTPATIENT_CLINIC_OR_DEPARTMENT_OTHER): Payer: Self-pay

## 2023-07-22 DIAGNOSIS — Z1231 Encounter for screening mammogram for malignant neoplasm of breast: Secondary | ICD-10-CM | POA: Insufficient documentation

## 2023-10-20 ENCOUNTER — Ambulatory Visit
Admission: EM | Admit: 2023-10-20 | Discharge: 2023-10-20 | Disposition: A | Discharge status: Home / Self Care | Payer: Federal, State, Local not specified - PPO | Attending: Family Medicine | Admitting: Family Medicine

## 2023-10-20 DIAGNOSIS — L03213 Periorbital cellulitis: Secondary | ICD-10-CM | POA: Diagnosis not present

## 2023-10-20 DIAGNOSIS — J988 Other specified respiratory disorders: Secondary | ICD-10-CM

## 2023-10-20 DIAGNOSIS — B9789 Other viral agents as the cause of diseases classified elsewhere: Secondary | ICD-10-CM

## 2023-10-20 DIAGNOSIS — J309 Allergic rhinitis, unspecified: Secondary | ICD-10-CM | POA: Diagnosis not present

## 2023-10-20 MED ORDER — CEFDINIR 300 MG PO CAPS: 300.0000 mg | ORAL_CAPSULE | Freq: Two times a day (BID) | ORAL | 0 refills | Status: DC

## 2023-10-20 MED ORDER — PREDNISONE 20 MG PO TABS: 20.0000 mg | ORAL_TABLET | Freq: Every day | ORAL | 0 refills | Status: DC

## 2023-10-20 NOTE — ED Triage Notes (Signed)
Pt reports nasal congestion, head pressure, itching and droopy left eye x 3 days. OTC cold meds gives relief.

## 2023-10-20 NOTE — ED Provider Notes (Signed)
[facility]  Note:  This document was prepared using Conservation officer, historic buildings and may include unintentional dictation errors.  MRN: [id number] DOB: [birth year]  Subjective:   Victoria Caldwell is a 57 y.o. female presenting for 3 day history of acute onset sinus congestion, sinus pressure, occasional cough, left eye pain and redness. No chest pain, shob, wheezing, throat pain.  Has a history of hypertension but is not taking any blood pressure medications currently.  No confusion, weakness, numbness or tingling, difficulty with speech, history of stroke.  No current facility-administered medications for this encounter.  Current Outpatient Medications:    famotidine (PEPCID) 40 MG tablet, TAKE 1 TABLET(40 MG) BY MOUTH DAILY, Disp: 90 tablet, Rfl: 1   fexofenadine (ALLEGRA ALLERGY) 180 MG tablet, Take 1 tablet (180 mg total) by mouth daily., Disp: 30 tablet, Rfl: 0   fluticasone (VERAMYST) 27.5 MCG/SPRAY nasal spray, Place 2 sprays into the nose daily., Disp: , Rfl:    Multiple Vitamins-Minerals (MULTIVITAMIN ADULT) CHEW, Chew 2 each by mouth daily., Disp: , Rfl:    No Known Allergies  Past Medical History:  Diagnosis Date   Back pain    Chickenpox    GERD (gastroesophageal reflux disease)    Joint pain    OSA (obstructive sleep apnea) 09/10/2018   Sinus problem      Past Surgical History:  Procedure Laterality Date   BACK SURGERY     LAPAROSCOPIC SUPRACERVICAL HYSTERECTOMY  06/26/2011   Procedure: LAPAROSCOPIC SUPRACERVICAL HYSTERECTOMY;  Surgeon: Oliver Pila;  Location: [facility];  Service: Gynecology;  Laterality: N/A;   SALPINGOOPHORECTOMY  06/26/2011   Procedure: SALPINGO OOPHERECTOMY;  Surgeon: Oliver Pila;  Location: [facility];  Service: Gynecology;  Laterality: Bilateral;    Family History  Problem Relation Age of Onset   Hypertension Other    Ovarian cysts Other    Cancer Mother 81       ovarian cancer   Heart attack Father 37    Kidney disease Father    AAA (abdominal aortic aneurysm) Father    Hypertension Father    Sudden death Father    Alcoholism Father    Obesity Father    Colon cancer Neg Hx    Esophageal cancer Neg Hx    Rectal cancer Neg Hx    Stomach cancer Neg Hx     Social History   Tobacco Use   Smoking status: Never    Passive exposure: Never   Smokeless tobacco: Never  Vaping Use   Vaping status: Never Used  Substance Use Topics   Alcohol use: Yes    Alcohol/week: 1.0 standard drink of alcohol    Types: 1 Glasses of wine per week    Comment: rare   Drug use: No    ROS   Objective:   Vitals: BP (!) 181/89 (BP Location: Right Arm)   Pulse 80   Temp 98.7 F (37.1 C) (Oral)   Resp 18   LMP 05/28/2011   SpO2 96%   BP Readings from Last 3 Encounters:  10/20/23 (!) 181/89  01/31/23 138/88  09/26/22 (!) 138/90   Physical Exam Constitutional:      General: She is not in acute distress.    Appearance: Normal appearance. She is well-developed and normal weight. She is not ill-appearing, toxic-appearing or diaphoretic.  HENT:     Head: Normocephalic and atraumatic.     Right Ear: Tympanic membrane, ear canal and external ear normal. No drainage or  tenderness. No middle ear effusion. There is no impacted cerumen. Tympanic membrane is not erythematous or bulging.     Left Ear: Tympanic membrane, ear canal and external ear normal. No drainage or tenderness.  No middle ear effusion. There is no impacted cerumen. Tympanic membrane is not erythematous or bulging.     Nose: Congestion present. No rhinorrhea.     Mouth/Throat:     Mouth: Mucous membranes are moist. No oral lesions.     Pharynx: No pharyngeal swelling, oropharyngeal exudate, posterior oropharyngeal erythema or uvula swelling.     Tonsils: No tonsillar exudate or tonsillar abscesses.  Eyes:     General: Lids are everted, no foreign bodies appreciated. Vision grossly intact. No scleral icterus.       Right eye: No  foreign body, discharge or hordeolum.        Left eye: No foreign body, discharge or hordeolum.     Extraocular Movements: Extraocular movements intact.     Right eye: Normal extraocular motion.     Left eye: Normal extraocular motion and no nystagmus.     Conjunctiva/sclera: Conjunctivae normal.     Right eye: Right conjunctiva is not injected. No chemosis, exudate or hemorrhage.    Left eye: Left conjunctiva is not injected. No chemosis, exudate or hemorrhage.  Cardiovascular:     Rate and Rhythm: Normal rate and regular rhythm.     Heart sounds: Normal heart sounds. No murmur heard.    No friction rub. No gallop.  Pulmonary:     Effort: Pulmonary effort is normal. No respiratory distress.     Breath sounds: No stridor. No wheezing, rhonchi or rales.  Chest:     Chest wall: No tenderness.  Musculoskeletal:     Cervical back: Normal range of motion and neck supple.  Lymphadenopathy:     Cervical: No cervical adenopathy.  Skin:    General: Skin is warm and dry.  Neurological:     General: No focal deficit present.     Mental Status: She is alert and oriented to person, place, and time.     Cranial Nerves: No cranial nerve deficit.     Motor: No weakness.     Coordination: Coordination normal.     Gait: Gait normal.  Psychiatric:        Mood and Affect: Mood normal.        Behavior: Behavior normal.     Assessment and Plan :   PDMP not reviewed this encounter.  1. Preseptal cellulitis of left upper eyelid   2. Allergic rhinitis, unspecified seasonality, unspecified trigger   3. Viral respiratory infection    Suspect primary issue is a viral respiratory infection that is complicated to preseptal cellulitis of the left upper eyelid.  Symptoms are likely worsened by her underlying allergic rhinitis.  Recommended treating with cefdinir, prednisone.  Will use a lower dose of the latter given her increased blood pressure.  Monitor for this and recheck with PCP.  Counseled  patient on potential for adverse effects with medications prescribed/recommended today, ER and return-to-clinic precautions discussed, patient verbalized understanding.    Wallis Bamberg, [place] 10/20/23 [phone]

## 2023-11-12 ENCOUNTER — Encounter: Payer: Self-pay | Admitting: Family Medicine

## 2023-11-12 ENCOUNTER — Ambulatory Visit (INDEPENDENT_AMBULATORY_CARE_PROVIDER_SITE_OTHER): Payer: Federal, State, Local not specified - PPO | Admitting: Family Medicine

## 2023-11-12 DIAGNOSIS — I10 Essential (primary) hypertension: Secondary | ICD-10-CM

## 2023-11-12 NOTE — Progress Notes (Signed)
 Brought patient back to the room. I said, Victoria Caldwell it looks likes you're here for your blood pressure. Pt stated She was here for her CPE. I reviewed patients chart and her last CPE was in 2023. Pt then states she was seen in urgent and was advised that her blood pressure was high. I advised patient that I would discuss with Lowne what kind of visit we should do. Pt states  If I can't have my physical I don't want to be here. I have to go to work while picking her belongings and standing up. I said okay and opened up the door and the patient left.     Pt left without being seen   Victoria JONELLE [name], DO

## 2023-11-13 ENCOUNTER — Telehealth: Payer: Self-pay

## 2023-11-13 NOTE — Telephone Encounter (Signed)
 LMOVM asking pt to call me back regarding an appt. Please transfer call to office.

## 2023-12-06 DIAGNOSIS — G4733 Obstructive sleep apnea (adult) (pediatric): Secondary | ICD-10-CM | POA: Diagnosis not present

## 2024-06-20 ENCOUNTER — Emergency Department (HOSPITAL_BASED_OUTPATIENT_CLINIC_OR_DEPARTMENT_OTHER): Admission: EM | Admit: 2024-06-20 | Discharge: 2024-06-20 | Disposition: A | Discharge status: Home / Self Care

## 2024-06-20 ENCOUNTER — Encounter (HOSPITAL_BASED_OUTPATIENT_CLINIC_OR_DEPARTMENT_OTHER): Payer: Self-pay | Admitting: Emergency Medicine

## 2024-06-20 ENCOUNTER — Emergency Department (HOSPITAL_BASED_OUTPATIENT_CLINIC_OR_DEPARTMENT_OTHER)

## 2024-06-20 ENCOUNTER — Other Ambulatory Visit: Payer: Self-pay

## 2024-06-20 DIAGNOSIS — S6992XA Unspecified injury of left wrist, hand and finger(s), initial encounter: Secondary | ICD-10-CM | POA: Diagnosis not present

## 2024-06-20 DIAGNOSIS — Y9351 Activity, roller skating (inline) and skateboarding: Secondary | ICD-10-CM | POA: Insufficient documentation

## 2024-06-20 DIAGNOSIS — S52532A Colles' fracture of left radius, initial encounter for closed fracture: Secondary | ICD-10-CM | POA: Insufficient documentation

## 2024-06-20 DIAGNOSIS — S52612A Displaced fracture of left ulna styloid process, initial encounter for closed fracture: Secondary | ICD-10-CM | POA: Diagnosis not present

## 2024-06-20 DIAGNOSIS — W19XXXA Unspecified fall, initial encounter: Secondary | ICD-10-CM

## 2024-06-20 DIAGNOSIS — S52352A Displaced comminuted fracture of shaft of radius, left arm, initial encounter for closed fracture: Secondary | ICD-10-CM | POA: Diagnosis not present

## 2024-06-20 MED ORDER — LIDOCAINE-EPINEPHRINE (PF) 2 %-1:200000 IJ SOLN
10.0000 mL | Freq: Once | INTRAMUSCULAR | Status: AC
  Administered 2024-06-20: 10 mL
  Filled 2024-06-20: qty 20

## 2024-06-20 MED ORDER — OXYCODONE-ACETAMINOPHEN 5-325 MG PO TABS
1.0000 | ORAL_TABLET | Freq: Once | ORAL | Status: AC
  Administered 2024-06-20: 1 via ORAL
  Filled 2024-06-20: qty 1

## 2024-06-20 MED ORDER — IBUPROFEN 600 MG PO TABS: 600.0000 mg | ORAL_TABLET | Freq: Four times a day (QID) | ORAL | 0 refills | Status: DC | PRN

## 2024-06-20 MED ORDER — OXYCODONE-ACETAMINOPHEN 5-325 MG PO TABS: 1.0000 | ORAL_TABLET | Freq: Four times a day (QID) | ORAL | 0 refills | Status: DC | PRN

## 2024-06-20 NOTE — ED Provider Notes (Signed)
  EMERGENCY DEPARTMENT AT [facility] Provider Note   CSN: [id number] Arrival date & time: 06/20/24  1742     Patient presents with: Arm Injury   Victoria Caldwell is a 58 y.o. female.   Patient to ED after fall earlier tonight while roller skating. She feels she put her left arm out to catch her fall and now has left forearm and wrist pain. No other injury. Denies hitting her head. Denies neck/chest/abdominal pain. She has been up walking without limitation of the pelvis or lower extremities. Not on anticoagulation.   The history is provided by the patient. No language interpreter was used.  Arm Injury      Prior to Admission medications   Medication Sig Start Date End Date Taking? Authorizing Provider  ibuprofen  (ADVIL ) 600 MG tablet Take 1 tablet (600 mg total) by mouth every 6 (six) hours as needed. 06/20/24  Yes Odell Balls, PA-C  oxyCODONE -acetaminophen  (PERCOCET/ROXICET) 5-325 MG tablet Take 1 tablet by mouth every 6 (six) hours as needed for severe pain (pain score 7-10). 06/20/24  Yes Grasiela Jonsson, PA-C  famotidine  (PEPCID ) 40 MG tablet TAKE 1 TABLET(40 MG) BY MOUTH DAILY 02/05/22   Antonio Meth, [name], DO  fexofenadine  (ALLEGRA  ALLERGY) 180 MG tablet Take 1 tablet (180 mg total) by mouth daily. 08/16/20   Antonio Meth [name], DO  Multiple Vitamins-Minerals (MULTIVITAMIN ADULT) CHEW Chew 2 each by mouth daily.    [provider]    Allergies: Patient has no known allergies.    Review of Systems  Updated Vital Signs BP (!) 166/89 (BP Location: Right Arm)   Pulse (!) 56   Temp 97.7 F (36.5 C) (Oral)   Resp 20   LMP 05/28/2011   SpO2 100%   Physical Exam Constitutional:      General: She is not in acute distress.    Appearance: She is well-developed. She is not ill-appearing.  Pulmonary:     Effort: Pulmonary effort is normal.  Musculoskeletal:        General: Normal range of motion.     Cervical back: Normal range of  motion.  Skin:    General: Skin is warm and dry.  Neurological:     Mental Status: She is alert and oriented to person, place, and time.     (all labs ordered are listed, but only abnormal results are displayed) Labs Reviewed - No data to display  EKG: None  Radiology: DG Forearm Left Result Date: 06/20/2024 CLINICAL DATA:  Fall and trauma to the right upper extremity. EXAM: LEFT FOREARM - 2 VIEW; LEFT WRIST - COMPLETE 3+ VIEW COMPARISON:  None Available. FINDINGS: There is a comminuted and mildly displaced fracture of the distal radius with dorsal angulation of the distal fracture fragment. Mildly displaced fracture of the ulnar styloid. No dislocation. Soft tissue swelling of the wrist. No radiopaque foreign object or soft tissue gas. IMPRESSION: 1. Comminuted and mildly displaced fracture of the distal radius. 2. Mildly displaced fracture of the ulnar styloid. Electronically Signed   By: Vanetta Chou M.D.   On: 06/20/2024 19:00   DG Wrist Complete Left Result Date: 06/20/2024 CLINICAL DATA:  Fall and trauma to the right upper extremity. EXAM: LEFT FOREARM - 2 VIEW; LEFT WRIST - COMPLETE 3+ VIEW COMPARISON:  None Available. FINDINGS: There is a comminuted and mildly displaced fracture of the distal radius with dorsal angulation of the distal fracture fragment. Mildly displaced fracture of the ulnar styloid. No dislocation.  Soft tissue swelling of the wrist. No radiopaque foreign object or soft tissue gas. IMPRESSION: 1. Comminuted and mildly displaced fracture of the distal radius. 2. Mildly displaced fracture of the ulnar styloid. Electronically Signed   By: Vanetta Chou M.D.   On: 06/20/2024 19:00     Procedures   Medications Ordered in the ED  oxyCODONE -acetaminophen  (PERCOCET/ROXICET) 5-325 MG per tablet 1 tablet (1 tablet Oral Given 06/20/24 1826)  lidocaine -EPINEPHrine  (XYLOCAINE  W/EPI) 2 %-1:200000 (PF) injection 10 mL (10 mLs Infiltration Given by Other 06/20/24 1923)     Clinical Course as of 06/20/24 2054  Sat Jun 20, 2024  2043 Patient fell while skating injuring her left wrist. She is right hand dominant. No other injury. Imaging per radiology:  IMPRESSION: 1. Comminuted and mildly displaced fracture of the distal radius. 2. Mildly displaced fracture of the ulnar styloid.    [SU]  2047 Per Dr. Alyse, she will be seen in the office on Monday. Referral provided. Will provide pain relief.   Splint applied.  [SU]    Clinical Course User Index [SU] Odell Balls, PA-C                                 Medical Decision Making Amount and/or Complexity of Data Reviewed Radiology: ordered.  Risk Prescription drug management.        Final diagnoses:  Fall, initial encounter  Closed Colles' fracture of left radius, initial encounter    ED Discharge Orders          Ordered    oxyCODONE -acetaminophen  (PERCOCET/ROXICET) 5-325 MG tablet  Every 6 hours PRN        06/20/24 2053    ibuprofen  (ADVIL ) 600 MG tablet  Every 6 hours PRN        06/20/24 2053               Odell Balls, PA-C 06/20/24 2054    Kammerer, Megan L, DO 06/21/24 1534

## 2024-06-20 NOTE — ED Triage Notes (Signed)
 Pt c/o pain to LFA s/p fall while roller skating

## 2024-06-20 NOTE — Discharge Instructions (Addendum)
 Ice and elevate to reduce swelling to the wrist. Take Percocet for pain as directed. You can take ibuprofen  as well.   Call Dr. Alyse' office on Monday morning and let them know you were advised to see Dr. Alyse for wrist fracture from the emergency department.

## 2024-06-23 DIAGNOSIS — S52532A Colles' fracture of left radius, initial encounter for closed fracture: Secondary | ICD-10-CM | POA: Diagnosis not present

## 2024-06-24 DIAGNOSIS — Y999 Unspecified external cause status: Secondary | ICD-10-CM | POA: Diagnosis not present

## 2024-06-24 DIAGNOSIS — S52572A Other intraarticular fracture of lower end of left radius, initial encounter for closed fracture: Secondary | ICD-10-CM | POA: Diagnosis not present

## 2024-06-24 DIAGNOSIS — X58XXXA Exposure to other specified factors, initial encounter: Secondary | ICD-10-CM | POA: Diagnosis not present

## 2024-07-10 DIAGNOSIS — S52532D Colles' fracture of left radius, subsequent encounter for closed fracture with routine healing: Secondary | ICD-10-CM | POA: Diagnosis not present

## 2024-07-24 DIAGNOSIS — S52532D Colles' fracture of left radius, subsequent encounter for closed fracture with routine healing: Secondary | ICD-10-CM | POA: Diagnosis not present

## 2024-08-12 DIAGNOSIS — M25532 Pain in left wrist: Secondary | ICD-10-CM | POA: Diagnosis not present

## 2024-08-27 DIAGNOSIS — S52532D Colles' fracture of left radius, subsequent encounter for closed fracture with routine healing: Secondary | ICD-10-CM | POA: Diagnosis not present

## 2024-09-16 ENCOUNTER — Other Ambulatory Visit (HOSPITAL_BASED_OUTPATIENT_CLINIC_OR_DEPARTMENT_OTHER): Payer: Self-pay | Admitting: Family Medicine

## 2024-09-16 DIAGNOSIS — Z1231 Encounter for screening mammogram for malignant neoplasm of breast: Secondary | ICD-10-CM

## 2024-09-20 ENCOUNTER — Ambulatory Visit (HOSPITAL_BASED_OUTPATIENT_CLINIC_OR_DEPARTMENT_OTHER)

## 2024-09-26 ENCOUNTER — Ambulatory Visit
Admission: EM | Admit: 2024-09-26 | Discharge: 2024-09-26 | Disposition: A | Discharge status: Home / Self Care | Attending: Family Medicine | Admitting: Family Medicine

## 2024-09-26 ENCOUNTER — Encounter: Payer: Self-pay | Admitting: Emergency Medicine

## 2024-09-26 DIAGNOSIS — L237 Allergic contact dermatitis due to plants, except food: Secondary | ICD-10-CM | POA: Diagnosis not present

## 2024-09-26 MED ORDER — METHYLPREDNISOLONE 4 MG PO TBPK: ORAL_TABLET | ORAL | 0 refills | Status: DC

## 2024-09-26 MED ORDER — METHYLPREDNISOLONE SODIUM SUCC 40 MG IJ SOLR
40.0000 mg | Freq: Once | INTRAMUSCULAR | Status: AC
  Administered 2024-09-26: 40 mg via INTRAMUSCULAR

## 2024-09-26 MED ORDER — TRIAMCINOLONE ACETONIDE 0.1 % EX CREA: 1.0000 | TOPICAL_CREAM | Freq: Two times a day (BID) | CUTANEOUS | 0 refills | Status: DC

## 2024-09-26 MED ORDER — METHYLPREDNISOLONE ACETATE 80 MG/ML IJ SUSP: 40.0000 mg | Freq: Once | INTRAMUSCULAR | Status: DC

## 2024-09-26 NOTE — Discharge Instructions (Signed)
 You were given a steroid shot in the clinic for your rash.  You may start the Medrol Dosepak tomorrow, 11/30.  You may use the topical triamcinolone  steroid cream twice daily as needed to the affected areas.  Please follow-up with your PCP if your symptoms do not improve.  Please go to the ER for any worsening symptoms.  I hope you feel better soon!

## 2024-09-26 NOTE — ED Provider Notes (Signed)
 [facility]    CSN: [id number] Arrival date & time: 09/26/24  0911      History   Chief Complaint Chief Complaint  Patient presents with   Poison Ivy    HPI Victoria Caldwell is a 58 y.o. female presents for rash.  Patient reports 6 days ago she was helping her son clear out some brush in the yard when she came in contact with poison oak.  Since then she has had a pruritic rash on her torso and arms.  No fevers or chills.  States she has had this in the past and this is the same presentation.  She has been using OTC treatments without relief.  No history of eczema or psoriasis.  No other concerns at this time   [facility]    Past Medical History:  Diagnosis Date   Back pain    Chickenpox    GERD (gastroesophageal reflux disease)    Joint pain    OSA (obstructive sleep apnea) 09/10/2018   Sinus problem     Patient Active Problem List   Diagnosis Date Noted   Obesity (BMI 30-39.9) 07/25/2022   Preventative health care 11/06/2021   HTN (hypertension) 11/06/2021   Suspected COVID-19 virus infection 10/07/2019   Rhinitis 09/18/2018   OSA (obstructive sleep apnea) 09/10/2018   Atypical squamous cells of undetermined significance (ASCUS) on Papanicolaou smear of cervix 01/26/2016    Past Surgical History:  Procedure Laterality Date   BACK SURGERY     LAPAROSCOPIC SUPRACERVICAL HYSTERECTOMY  06/26/2011   Procedure: LAPAROSCOPIC SUPRACERVICAL HYSTERECTOMY;  Surgeon: Nathanel LELON Bunker;  Location: [facility];  Service: Gynecology;  Laterality: N/A;   SALPINGOOPHORECTOMY  06/26/2011   Procedure: SALPINGO OOPHERECTOMY;  Surgeon: Nathanel LELON Bunker;  Location: [facility];  Service: Gynecology;  Laterality: Bilateral;    OB History     Gravida  2   Para  2   Term  2   Preterm      AB      Living  2      SAB      IAB      Ectopic      Multiple      Live Births               Home Medications    Prior to Admission medications   Medication Sig  Start Date End Date Taking? Authorizing Provider  methylPREDNISolone (MEDROL DOSEPAK) 4 MG TBPK tablet Take as prescribed on package 09/27/24  Yes Ianna Salmela, Jodi R, NP  triamcinolone  cream (KENALOG) 0.1 % Apply 1 Application topically 2 (two) times daily. 09/26/24  Yes Leeroy Lovings, Jodi R, NP  famotidine  (PEPCID ) 40 MG tablet TAKE 1 TABLET(40 MG) BY MOUTH DAILY 02/05/22   Antonio Meth, Yvonne R, DO  fexofenadine  (ALLEGRA  ALLERGY) 180 MG tablet Take 1 tablet (180 mg total) by mouth daily. 08/16/20   Antonio Meth Jamee JONELLE, DO  ibuprofen  (ADVIL ) 600 MG tablet Take 1 tablet (600 mg total) by mouth every 6 (six) hours as needed. 06/20/24   Odell Balls, PA-C  Multiple Vitamins-Minerals (MULTIVITAMIN ADULT) CHEW Chew 2 each by mouth daily.    [provider]  oxyCODONE -acetaminophen  (PERCOCET/ROXICET) 5-325 MG tablet Take 1 tablet by mouth every 6 (six) hours as needed for severe pain (pain score 7-10). 06/20/24   Odell Balls, PA-C    Family History Family History  Problem Relation Age of Onset   Hypertension Other    Ovarian cysts Other  Cancer Mother 89       ovarian cancer   Heart attack Father 4   Kidney disease Father    AAA (abdominal aortic aneurysm) Father    Hypertension Father    Sudden death Father    Alcoholism Father    Obesity Father    Colon cancer Neg Hx    Esophageal cancer Neg Hx    Rectal cancer Neg Hx    Stomach cancer Neg Hx     Social History Social History   Tobacco Use   Smoking status: Never    Passive exposure: Never   Smokeless tobacco: Never  Vaping Use   Vaping status: Never Used  Substance Use Topics   Alcohol use: Yes    Alcohol/week: 1.0 standard drink of alcohol    Types: 1 Glasses of wine per week    Comment: rare   Drug use: No     Allergies   Patient has no known allergies.   Review of Systems Review of Systems  Skin:  Positive for rash.     Physical Exam Triage Vital Signs ED Triage Vitals  Encounter Vitals Group     BP  09/26/24 0948 (!) 154/85     Girls Systolic BP Percentile --      Girls Diastolic BP Percentile --      Boys Systolic BP Percentile --      Boys Diastolic BP Percentile --      Pulse Rate 09/26/24 0948 70     Resp --      Temp 09/26/24 0948 98.4 F (36.9 C)     Temp Source 09/26/24 0948 Oral     SpO2 09/26/24 0948 98 %     Weight --      Height --      Head Circumference --      Peak Flow --      Pain Score 09/26/24 0946 0     Pain Loc --      Pain Education --      Exclude from Growth Chart --    No data found.  Updated Vital Signs BP (!) 154/85 (BP Location: Left Arm)   Pulse 70   Temp 98.4 F (36.9 C) (Oral)   LMP 05/28/2011   SpO2 98%   Visual Acuity Right Eye Distance:   Left Eye Distance:   Bilateral Distance:    Right Eye Near:   Left Eye Near:    Bilateral Near:     Physical Exam Vitals and nursing note reviewed.  Constitutional:      General: She is not in acute distress.    Appearance: Normal appearance. She is not ill-appearing.  HENT:     Head: Normocephalic and atraumatic.  Eyes:     Pupils: Pupils are equal, round, and reactive to light.  Cardiovascular:     Rate and Rhythm: Normal rate.  Pulmonary:     Effort: Pulmonary effort is normal.  Skin:    General: Skin is warm and dry.     Findings: Rash present. Rash is papular.     Comments: Erythematous papular rash scattered on trunk and bilateral arms.  Some scabbed regions.  No swelling, drainage, warmth.  Neurological:     General: No focal deficit present.     Mental Status: She is alert and oriented to person, place, and time.  Psychiatric:        Mood and Affect: Mood normal.        Behavior: Behavior  normal.      UC Treatments / Results  Labs (all labs ordered are listed, but only abnormal results are displayed) Labs Reviewed - No data to display  EKG   Radiology No results found.  Procedures Procedures (including critical care time)  Medications Ordered in  UC Medications  methylPREDNISolone sodium succinate (SOLU-MEDROL) 40 mg/mL injection 40 mg (has no administration in time range)    Initial Impression / Assessment and Plan / UC Course  I have reviewed the triage vital signs and the nursing notes.  Pertinent labs & imaging results that were available during my care of the patient were reviewed by me and considered in my medical decision making (see chart for details).     Reviewed exam and symptoms with patient.  Discussed dermatitis.  Patient requested steroid injection in clinic, Solu-Medrol given.  Will do topical triamcinolone  to use twice daily as needed and start Medrol Dosepak tomorrow, 11/30.  Advised PCP follow-up if symptoms do not improve.  ER precautions reviewed. Final Clinical Impressions(s) / UC Diagnoses   Final diagnoses:  Allergic contact dermatitis due to plant     Discharge Instructions      You were given a steroid shot in the clinic for your rash.  You may start the Medrol Dosepak tomorrow, 11/30.  You may use the topical triamcinolone  steroid cream twice daily as needed to the affected areas.  Please follow-up with your PCP if your symptoms do not improve.  Please go to the ER for any worsening symptoms.  I hope you feel better soon!     ED Prescriptions     Medication Sig Dispense Auth. Provider   triamcinolone  cream (KENALOG) 0.1 % Apply 1 Application topically 2 (two) times daily. 45 g Fynlee Rowlands, Jodi R, NP   methylPREDNISolone (MEDROL DOSEPAK) 4 MG TBPK tablet Take as prescribed on package 21 tablet Bladimir Auman, Jodi R, NP      PDMP not reviewed this encounter.   Loreda Myla SAUNDERS, NP 09/26/24 1010

## 2024-09-26 NOTE — ED Triage Notes (Signed)
 Pt c/o poison oak contact since last Sunday. Pt states she has tried various OTC products but has no relief. Pt states it is mostly on her trunk and arms.

## 2024-11-02 ENCOUNTER — Encounter: Payer: Self-pay | Admitting: Family Medicine

## 2024-11-02 ENCOUNTER — Ambulatory Visit: Payer: Self-pay

## 2024-11-02 ENCOUNTER — Telehealth (INDEPENDENT_AMBULATORY_CARE_PROVIDER_SITE_OTHER): Admitting: Family Medicine

## 2024-11-02 DIAGNOSIS — H1033 Unspecified acute conjunctivitis, bilateral: Secondary | ICD-10-CM | POA: Diagnosis not present

## 2024-11-02 MED ORDER — POLYMYXIN B-TRIMETHOPRIM 10000-0.1 UNIT/ML-% OP SOLN: 2.0000 [drp] | Freq: Four times a day (QID) | OPHTHALMIC | 0 refills | Status: AC

## 2024-11-02 NOTE — Assessment & Plan Note (Addendum)
 Reports left eye stuck together this morning with redness. There is erythema noted in the left conjunctiva. Symptoms are spreading to the right eye.  No purulent exudate today.  Polytrim  opth drops 2 drops in both eyes every 6 hours for next 7 days. Follow-up with PCP if symptoms do not resolve.

## 2024-11-02 NOTE — Telephone Encounter (Signed)
 FYI Only or Action Required?: Action required by provider: request for appointment.  Patient was last seen in primary care on 11/12/2023 by Victoria Caldwell, [name], DO.  Called Nurse Triage reporting Facial Pain.  Symptoms began yesterday.  Interventions attempted: Rest, hydration, or home remedies.  Symptoms are: gradually worsening. Sinus pain, facial and eye pain.   Triage Disposition: See HCP Within 4 Hours (Or PCP Triage)  Patient/caregiver understands and will follow disposition?: Yes      Copied from CRM [id number]. Topic: Clinical - Red Word Triage >> Nov 02, 2024  9:09 AM Amy B wrote: Red Word that prompted transfer to Nurse Triage: Sinus infection, pink eye, pain in left eye Reason for Disposition  [1] SEVERE sinus pain (e.g., excruciating) AND [2] not improved 2 hours after pain medicine  Answer Assessment - Initial Assessment Questions 1. LOCATION: Where does it hurt?      Face, eyes 2. ONSET: When did the sinus pain start?  (e.g., hours, days)      Sunday 3. SEVERITY: How bad is the pain?   (Scale 0-10; or none, mild, moderate or severe)     20  4. RECURRENT SYMPTOM: Have you ever had sinus problems before? If Yes, ask: When was the last time? and What happened that time?      yes 5. NASAL CONGESTION: Is the nose blocked? If Yes, ask: Can you open it or must you breathe through your mouth?     no 6. NASAL DISCHARGE: Do you have discharge from your nose? If so ask, What color?     clear 7. FEVER: Do you have a fever? If Yes, ask: What is it, how was it measured, and when did it start?      no 8. OTHER SYMPTOMS: Do you have any other symptoms? (e.g., sore throat, cough, earache, difficulty breathing)     no 9. PREGNANCY: Is there any chance you are pregnant? When was your last menstrual period?     no  Protocols used: Sinus Pain or Congestion-A-AH

## 2024-11-02 NOTE — Progress Notes (Signed)
 "  Acute Office Visit  Subjective:     Patient ID: Victoria Caldwell, female    DOB: 1965/11/29, 59 y.o.   MRN: [id number]  Chief Complaint  Patient presents with   Sinusitis  I connected with  Maleiya A Llorens on 11/02/2024 by a video enabled telemedicine application and verified that I am speaking with the correct person using two identifiers.   I discussed the limitations of evaluation and management by telemedicine. The patient expressed understanding and agreed to proceed. Virtual Visit via Video Note  I connected with Carrieann A Etzkorn on 11/02/2024 at  4:10 PM EST by a video enabled telemedicine application and verified that I am speaking with the correct person using two identifiers.  Location: Patient: home  Provider: office    I discussed the limitations of evaluation and management by telemedicine and the availability of in person appointments. The patient expressed understanding and agreed to proceed.  History of Present Illness:    Observations/Objective:   Assessment and Plan:   Follow Up Instructions:    I discussed the assessment and treatment plan with the patient. The patient was provided an opportunity to ask questions and all were answered. The patient agreed with the plan and demonstrated an understanding of the instructions.   The patient was advised to call back or seek an in-person evaluation if the symptoms worsen or if the condition fails to improve as anticipated.     [name], FNP   HPI Patient is in today for itchy and watery eyes. Reports left eye was closed shut this morning. Originally thought she has a sinus infection, however, she is describing conjunctivitis. Symptoms have been present for one day. On camera, provider was able to visualize left conjunctiva with erythema. Symptoms are spreading to the right eye as well.    ROS      Objective:    LMP 05/28/2011      Physical Exam Nursing note reviewed.  Constitutional:       General: She is not in acute distress.    Appearance: Normal appearance.  Eyes:     Conjunctiva/sclera:     Left eye: Left conjunctiva is injected. No exudate. Pulmonary:     Effort: Pulmonary effort is normal.  Skin:    General: Skin is warm and dry.  Neurological:     General: No focal deficit present.     Mental Status: She is alert. Mental status is at baseline.  Psychiatric:        Mood and Affect: Mood normal.        Behavior: Behavior normal.        Thought Content: Thought content normal.        Judgment: Judgment normal.     No results found for any visits on 11/02/24.      Assessment & Plan:   Problem List Items Addressed This Visit     Acute bacterial conjunctivitis of both eyes - Primary   Reports left eye stuck together this morning with redness. There is erythema noted in the left conjunctiva. Symptoms are spreading to the right eye.  No purulent exudate today.  Polytrim  opth drops 2 drops in both eyes every 6 hours for next 7 days. Follow-up with PCP if symptoms do not resolve.          Relevant Medications   trimethoprim -polymyxin b  (POLYTRIM ) ophthalmic solution    Meds ordered this encounter  Medications   trimethoprim -polymyxin b  (POLYTRIM ) ophthalmic solution  Sig: Place 2 drops into both eyes every 6 (six) hours.    Dispense:  10 mL    Refill:  0    Supervising Provider:   METHENEY, CATHERINE D [[id number]]  Agrees with plan of care discussed.  Questions answered.   Return if symptoms worsen or fail to improve.  [name], FNP   "

## 2024-11-03 ENCOUNTER — Encounter (HOSPITAL_BASED_OUTPATIENT_CLINIC_OR_DEPARTMENT_OTHER): Payer: Self-pay

## 2024-11-03 ENCOUNTER — Ambulatory Visit (HOSPITAL_BASED_OUTPATIENT_CLINIC_OR_DEPARTMENT_OTHER)
Admission: RE | Admit: 2024-11-03 | Discharge: 2024-11-03 | Disposition: A | Discharge status: Home / Self Care | Source: Ambulatory Visit | Attending: Family Medicine | Admitting: Family Medicine

## 2024-11-03 DIAGNOSIS — Z1231 Encounter for screening mammogram for malignant neoplasm of breast: Secondary | ICD-10-CM | POA: Insufficient documentation

## 2024-11-13 ENCOUNTER — Ambulatory Visit: Admitting: Family Medicine

## 2024-11-13 ENCOUNTER — Encounter: Payer: Self-pay | Admitting: Family Medicine

## 2024-11-13 VITALS — BP 136/78 | HR 74 | Temp 98.2°F | Resp 18 | Ht 64.0 in | Wt 223.8 lb

## 2024-11-13 DIAGNOSIS — Z0000 Encounter for general adult medical examination without abnormal findings: Secondary | ICD-10-CM | POA: Diagnosis not present

## 2024-11-13 DIAGNOSIS — I10 Essential (primary) hypertension: Secondary | ICD-10-CM | POA: Diagnosis not present

## 2024-11-13 DIAGNOSIS — G4733 Obstructive sleep apnea (adult) (pediatric): Secondary | ICD-10-CM | POA: Diagnosis not present

## 2024-11-13 DIAGNOSIS — Z23 Encounter for immunization: Secondary | ICD-10-CM | POA: Diagnosis not present

## 2024-11-13 LAB — CBC WITH DIFFERENTIAL/PLATELET
Basophils Absolute: 0 K/uL (ref 0.0–0.1)
Basophils Relative: 0 % (ref 0.0–3.0)
Eosinophils Absolute: 0.1 K/uL (ref 0.0–0.7)
Eosinophils Relative: 2 % (ref 0.0–5.0)
HCT: 36.4 % (ref 36.0–46.0)
Hemoglobin: 12.1 g/dL (ref 12.0–15.0)
Lymphocytes Relative: 37 % (ref 12.0–46.0)
Lymphs Abs: 2.1 K/uL (ref 0.7–4.0)
MCHC: 33.2 g/dL (ref 30.0–36.0)
MCV: 82.9 fl (ref 78.0–100.0)
Monocytes Absolute: 0.5 K/uL (ref 0.1–1.0)
Monocytes Relative: 9 % (ref 3.0–12.0)
Neutro Abs: 2.9 K/uL (ref 1.4–7.7)
Neutrophils Relative %: 52 % (ref 43.0–77.0)
Platelets: 227 K/uL (ref 150.0–400.0)
RBC: 4.39 Mil/uL (ref 3.87–5.11)
RDW: 15.8 % — ABNORMAL HIGH (ref 11.5–15.5)
WBC: 5.6 K/uL (ref 4.0–10.5)
nRBC: 0 /100{WBCs} (ref 0–4)

## 2024-11-13 LAB — COMPREHENSIVE METABOLIC PANEL WITH GFR
ALT: 17 U/L (ref 3–35)
AST: 17 U/L (ref 5–37)
Albumin: 4.2 g/dL (ref 3.5–5.2)
Alkaline Phosphatase: 61 U/L (ref 39–117)
BUN: 15 mg/dL (ref 6–23)
CO2: 28 meq/L (ref 19–32)
Calcium: 9.1 mg/dL (ref 8.4–10.5)
Chloride: 108 meq/L (ref 96–112)
Creatinine, Ser: 0.72 mg/dL (ref 0.40–1.20)
GFR: 90.87 mL/min
Glucose, Bld: 79 mg/dL (ref 70–99)
Potassium: 4.4 meq/L (ref 3.5–5.1)
Sodium: 143 meq/L (ref 135–145)
Total Bilirubin: 0.4 mg/dL (ref 0.2–1.2)
Total Protein: 7 g/dL (ref 6.0–8.3)

## 2024-11-13 LAB — LIPID PANEL
Cholesterol: 157 mg/dL (ref 28–200)
HDL: 61.5 mg/dL
LDL Cholesterol: 85 mg/dL (ref 10–99)
NonHDL: 95.4
Total CHOL/HDL Ratio: 3
Triglycerides: 50 mg/dL (ref 10.0–149.0)
VLDL: 10 mg/dL (ref 0.0–40.0)

## 2024-11-13 LAB — TSH: TSH: 1.72 u[IU]/mL (ref 0.35–5.50)

## 2024-11-13 NOTE — Patient Instructions (Signed)
 Preventive Care 61-61 Years Old, Female  Preventive care refers to lifestyle choices and visits with your health care provider that can promote health and wellness. Preventive care visits are also called wellness exams.  What can I expect for my preventive care visit?  Counseling  Your health care provider may ask you questions about your:  Medical history, including:  Past medical problems.  Family medical history.  Pregnancy history.  Current health, including:  Menstrual cycle.  Method of birth control.  Emotional well-being.  Home life and relationship well-being.  Sexual activity and sexual health.  Lifestyle, including:  Alcohol, nicotine or tobacco, and drug use.  Access to firearms.  Diet, exercise, and sleep habits.  Work and work Astronomer.  Sunscreen use.  Safety issues such as seatbelt and bike helmet use.  Physical exam  Your health care provider will check your:  Height and weight. These may be used to calculate your BMI (body mass index). BMI is a measurement that tells if you are at a healthy weight.  Waist circumference. This measures the distance around your waistline. This measurement also tells if you are at a healthy weight and may help predict your risk of certain diseases, such as type 2 diabetes and high blood pressure.  Heart rate and blood pressure.  Body temperature.  Skin for abnormal spots.  What immunizations do I need?    Vaccines are usually given at various ages, according to a schedule. Your health care provider will recommend vaccines for you based on your age, medical history, and lifestyle or other factors, such as travel or where you work.  What tests do I need?  Screening  Your health care provider may recommend screening tests for certain conditions. This may include:  Lipid and cholesterol levels.  Diabetes screening. This is done by checking your blood sugar (glucose) after you have not eaten for a while (fasting).  Pelvic exam and Pap test.  Hepatitis B test.  Hepatitis C  test.  HIV (human immunodeficiency virus) test.  STI (sexually transmitted infection) testing, if you are at risk.  Lung cancer screening.  Colorectal cancer screening.  Mammogram. Talk with your health care provider about when you should start having regular mammograms. This may depend on whether you have a family history of breast cancer.  BRCA-related cancer screening. This may be done if you have a family history of breast, ovarian, tubal, or peritoneal cancers.  Bone density scan. This is done to screen for osteoporosis.  Talk with your health care provider about your test results, treatment options, and if necessary, the need for more tests.  Follow these instructions at home:  Eating and drinking    Eat a diet that includes fresh fruits and vegetables, whole grains, lean protein, and low-fat dairy products.  Take vitamin and mineral supplements as recommended by your health care provider.  Do not drink alcohol if:  Your health care provider tells you not to drink.  You are pregnant, may be pregnant, or are planning to become pregnant.  If you drink alcohol:  Limit how much you have to 0-1 drink a day.  Know how much alcohol is in your drink. In the [place] one drink equals one 12 oz bottle of beer (355 mL), one 5 oz glass of wine (148 mL), or one 1 oz glass of hard liquor (44 mL).  Lifestyle  Brush your teeth every morning and night with fluoride toothpaste. Floss one time each day.  Exercise for at least  30 minutes 5 or more days each week.  Do not use any products that contain nicotine or tobacco. These products include cigarettes, chewing tobacco, and vaping devices, such as e-cigarettes. If you need help quitting, ask your health care provider.  Do not use drugs.  If you are sexually active, practice safe sex. Use a condom or other form of protection to prevent STIs.  If you do not wish to become pregnant, use a form of birth control. If you plan to become pregnant, see your health care provider for a  prepregnancy visit.  Take aspirin only as told by your health care provider. Make sure that you understand how much to take and what form to take. Work with your health care provider to find out whether it is safe and beneficial for you to take aspirin daily.  Find healthy ways to manage stress, such as:  Meditation, yoga, or listening to music.  Journaling.  Talking to a trusted person.  Spending time with friends and family.  Minimize exposure to UV radiation to reduce your risk of skin cancer.  Safety  Always wear your seat belt while driving or riding in a vehicle.  Do not drive:  If you have been drinking alcohol. Do not ride with someone who has been drinking.  When you are tired or distracted.  While texting.  If you have been using any mind-altering substances or drugs.  Wear a helmet and other protective equipment during sports activities.  If you have firearms in your house, make sure you follow all gun safety procedures.  Seek help if you have been physically or sexually abused.  What's next?  Visit your health care provider once a year for an annual wellness visit.  Ask your health care provider how often you should have your eyes and teeth checked.  Stay up to date on all vaccines.  This information is not intended to replace advice given to you by your health care provider. Make sure you discuss any questions you have with your health care provider.  Document Revised: 04/12/2021 Document Reviewed: 04/12/2021  Elsevier Patient Education  2024 [employer].

## 2024-11-13 NOTE — Assessment & Plan Note (Signed)
 Mild-- on no cpap

## 2024-11-13 NOTE — Progress Notes (Signed)
 "  Subjective:    Patient ID: Victoria Caldwell, female    DOB: 24-Jul-1966, 59 y.o.   MRN: [id number]  Chief Complaint  Patient presents with   Annual Exam    Pt states fasting     HPI Patient is in today for cpe.  Discussed the use of AI scribe software for clinical note transcription with the patient, who gave verbal consent to proceed.  History of Present Illness Victoria Caldwell is a 59 year old female who presents for a routine physical exam.  She has been actively managing her weight, having previously lost 50 pounds using Opdivo, but discontinued it due to the difficulty of maintaining the regimen. Currently, she is focusing on dietary modifications and plans to increase physical activity following her upcoming knee surgery at the end of the month. Her knee pain has significantly hindered her ability to engage in physical activity.  She experienced a left wrist fracture after a fall while roller skating, which required surgical intervention with pins for stabilization. The injury occurred while she was attempting to assist another person who had fallen.  Recently, she developed conjunctivitis, which she suspects was contracted from the hospital. She is not taking any medications for this condition.  She works at the [employer] in [place] and plans to continue working there until she is 43, partly due to healthcare considerations. She has not yet received a flu shot or pneumonia vaccine but is considering the pneumonia vaccine as she is eligible. Her mammogram was recently completed, and she is up to date with her tetanus and shingles vaccinations. She is not interested in receiving an MMR booster at this time. No changes in family history, and no new or concerning moles. Her joints are described as 'just joints', indicating no significant new joint issues.      Past Surgical History:  Procedure Laterality Date   BACK SURGERY     LAPAROSCOPIC SUPRACERVICAL HYSTERECTOMY  06/26/2011    Procedure: LAPAROSCOPIC SUPRACERVICAL HYSTERECTOMY;  Surgeon: Nathanel LELON Bunker;  Location: [facility];  Service: Gynecology;  Laterality: N/A;   SALPINGOOPHORECTOMY  06/26/2011   Procedure: SALPINGO OOPHERECTOMY;  Surgeon: Nathanel LELON Bunker;  Location: [facility];  Service: Gynecology;  Laterality: Bilateral;   WRIST FRACTURE SURGERY Left     Family History  Problem Relation Age of Onset   Hypertension Other    Ovarian cysts Other    Cancer Mother 42       ovarian cancer   Heart attack Father 29   Kidney disease Father    AAA (abdominal aortic aneurysm) Father    Hypertension Father    Sudden death Father    Alcoholism Father    Obesity Father    Colon cancer Neg Hx    Esophageal cancer Neg Hx    Rectal cancer Neg Hx    Stomach cancer Neg Hx     Social History   Socioeconomic History   Marital status: Married    Spouse name: Marne Meline   Number of children: Not on file   Years of education: Not on file   Highest education level: Not on file  Occupational History   Occupation: Social Research Officer, [employer]  Tobacco Use   Smoking status: Never    Passive exposure: Never   Smokeless tobacco: Never  Vaping Use   Vaping status: Never Used  Substance and Sexual Activity   Alcohol use: Yes    Alcohol/week: 1.0 standard drink of alcohol    Types: 1 Glasses  of wine per week    Comment: rare   Drug use: No   Sexual activity: Not Currently  Other Topics Concern   Not on file  Social History Narrative   Exercise--- no   Social Drivers of Health   Tobacco Use: Low Risk (11/13/2024)   Patient History    Smoking Tobacco Use: Never    Smokeless Tobacco Use: Never    Passive Exposure: Never  Financial Resource Strain: Patient Declined (11/11/2024)   Overall Financial Resource Strain (CARDIA)    Difficulty of Paying Living Expenses: Patient declined  Food Insecurity: Patient Declined (11/11/2024)   Epic    Worried About Programme Researcher, Broadcasting/film/video in the Last Year: Patient  declined    Barista in the Last Year: Patient declined  Transportation Needs: Patient Declined (11/11/2024)   Epic    Lack of Transportation (Medical): Patient declined    Lack of Transportation (Non-Medical): Patient declined  Physical Activity: Inactive (11/11/2024)   Exercise Vital Sign    Days of Exercise per Week: 0 days    Minutes of Exercise per Session: Not on file  Stress: Patient Declined (11/11/2024)   [employer] of Occupational Health - Occupational Stress Questionnaire    Feeling of Stress: Patient declined  Social Connections: Unknown (11/11/2024)   Social Connection and Isolation Panel    Frequency of Communication with Friends and Family: Patient declined    Frequency of Social Gatherings with Friends and Family: Patient declined    Attends Religious Services: Patient declined    Active Member of Clubs or Organizations: Patient declined    Attends Banker Meetings: Not on file    Marital Status: Patient declined  Intimate Partner Violence: Not on file  Depression (PHQ2-9): Low Risk (11/13/2024)   Depression (PHQ2-9)    PHQ-2 Score: 0  Alcohol Screen: Low Risk (11/11/2023)   Alcohol Screen    Last Alcohol Screening Score (AUDIT): 2  Housing: Patient Declined (11/11/2024)   Epic    Unable to Pay for Housing in the Last Year: Patient declined    Number of Times Moved in the Last Year: Not on file    Homeless in the Last Year: Patient declined  Utilities: Not on file  Health Literacy: Not on file    Outpatient Medications Prior to Visit  Medication Sig Dispense Refill   famotidine  (PEPCID ) 40 MG tablet TAKE 1 TABLET(40 MG) BY MOUTH DAILY 90 tablet 1   fexofenadine  (ALLEGRA  ALLERGY) 180 MG tablet Take 1 tablet (180 mg total) by mouth daily. 30 tablet 0   Multiple Vitamins-Minerals (MULTIVITAMIN ADULT) CHEW Chew 2 each by mouth daily.     trimethoprim -polymyxin b  (POLYTRIM ) ophthalmic solution Place 2 drops into both eyes every 6 (six) hours.  10 mL 0   amoxicillin  (AMOXIL ) 500 MG tablet Oral; Duration: 7 Days     ibuprofen  (ADVIL ) 600 MG tablet Take 1 tablet (600 mg total) by mouth every 6 (six) hours as needed. 30 tablet 0   methylPREDNISolone  (MEDROL  DOSEPAK) 4 MG TBPK tablet Take as prescribed on package (Patient not taking: Reported on 11/02/2024) 21 tablet 0   oxyCODONE -acetaminophen  (PERCOCET/ROXICET) 5-325 MG tablet Take 1 tablet by mouth every 6 (six) hours as needed for severe pain (pain score 7-10). (Patient not taking: Reported on 11/02/2024) 15 tablet 0   triamcinolone  cream (KENALOG ) 0.1 % Apply 1 Application topically 2 (two) times daily. (Patient not taking: Reported on 11/02/2024) 45 g 0   No facility-administered medications prior  to visit.    No Known Allergies  Review of Systems  Constitutional:  Negative for fever and malaise/fatigue.  HENT:  Negative for congestion.   Eyes:  Negative for blurred vision.  Respiratory:  Negative for shortness of breath.   Cardiovascular:  Negative for chest pain, palpitations and leg swelling.  Gastrointestinal:  Negative for abdominal pain, blood in stool and nausea.  Genitourinary:  Negative for dysuria and frequency.  Musculoskeletal:  Negative for falls.  Skin:  Negative for rash.  Neurological:  Negative for dizziness, loss of consciousness and headaches.  Endo/Heme/Allergies:  Negative for environmental allergies.  Psychiatric/Behavioral:  Negative for depression. The patient is not nervous/anxious.        Objective:    Physical Exam Vitals and nursing note reviewed.  Constitutional:      General: She is not in acute distress.    Appearance: Normal appearance. She is well-developed.  HENT:     Head: Normocephalic and atraumatic.     Right Ear: Tympanic membrane, ear canal and external ear normal. There is no impacted cerumen.     Left Ear: Tympanic membrane, ear canal and external ear normal. There is no impacted cerumen.     Nose: Nose normal.     Mouth/Throat:      Mouth: Mucous membranes are moist.     Pharynx: Oropharynx is clear. No oropharyngeal exudate or posterior oropharyngeal erythema.  Eyes:     General: No scleral icterus.       Right eye: No discharge.        Left eye: No discharge.     Conjunctiva/sclera: Conjunctivae normal.     Pupils: Pupils are equal, round, and reactive to light.  Neck:     Thyroid : No thyromegaly or thyroid  tenderness.     Vascular: No JVD.  Cardiovascular:     Rate and Rhythm: Normal rate and regular rhythm.     Heart sounds: Normal heart sounds. No murmur heard. Pulmonary:     Effort: Pulmonary effort is normal. No respiratory distress.     Breath sounds: Normal breath sounds.  Abdominal:     General: Bowel sounds are normal. There is no distension.     Palpations: Abdomen is soft. There is no mass.     Tenderness: There is no abdominal tenderness. There is no guarding or rebound.  Genitourinary:    Vagina: Normal.  Musculoskeletal:        General: Normal range of motion.     Cervical back: Normal range of motion and neck supple.     Right lower leg: No edema.     Left lower leg: No edema.  Lymphadenopathy:     Cervical: No cervical adenopathy.  Skin:    General: Skin is warm and dry.     Findings: No erythema or rash.  Neurological:     Mental Status: She is alert and oriented to person, place, and time.     Cranial Nerves: No cranial nerve deficit.     Deep Tendon Reflexes: Reflexes are normal and symmetric.  Psychiatric:        Mood and Affect: Mood normal.        Behavior: Behavior normal.        Thought Content: Thought content normal.        Judgment: Judgment normal.     BP 136/78 (BP Location: Left Arm, Patient Position: Sitting, Cuff Size: Large)   Pulse 74   Temp 98.2 F (36.8 C) (Oral)  Resp 18   Ht 5' 4 (1.626 m)   Wt 223 lb 12.8 oz (101.5 kg)   LMP 05/28/2011   SpO2 98%   BMI 38.42 kg/m  Wt Readings from Last 3 Encounters:  11/13/24 223 lb 12.8 oz (101.5 kg)   01/31/23 220 lb (99.8 kg)  09/26/22 217 lb 9.6 oz (98.7 kg)    Diabetic Foot Exam - Simple   No data filed    Lab Results  Component Value Date   WBC 5.9 11/06/2021   HGB 12.4 11/06/2021   HCT 39.3 11/06/2021   PLT 251.0 11/06/2021   GLUCOSE 86 11/06/2021   CHOL 183 11/06/2021   TRIG 85.0 11/06/2021   HDL 63.40 11/06/2021   LDLCALC 103 (H) 11/06/2021   ALT 19 11/06/2021   AST 17 11/06/2021   NA 138 11/06/2021   K 4.2 11/06/2021   CL 105 11/06/2021   CREATININE 0.72 11/06/2021   BUN 13 11/06/2021   CO2 26 11/06/2021   TSH 2.81 11/06/2021   HGBA1C 5.7 (H) 09/18/2018    Lab Results  Component Value Date   TSH 2.81 11/06/2021   Lab Results  Component Value Date   WBC 5.9 11/06/2021   HGB 12.4 11/06/2021   HCT 39.3 11/06/2021   MCV 85.7 11/06/2021   PLT 251.0 11/06/2021   Lab Results  Component Value Date   NA 138 11/06/2021   K 4.2 11/06/2021   CO2 26 11/06/2021   GLUCOSE 86 11/06/2021   BUN 13 11/06/2021   CREATININE 0.72 11/06/2021   BILITOT 0.4 11/06/2021   ALKPHOS 55 11/06/2021   AST 17 11/06/2021   ALT 19 11/06/2021   PROT 7.4 11/06/2021   ALBUMIN 4.5 11/06/2021   CALCIUM 9.4 11/06/2021   GFR 92.81 11/06/2021   Lab Results  Component Value Date   CHOL 183 11/06/2021   Lab Results  Component Value Date   HDL 63.40 11/06/2021   Lab Results  Component Value Date   LDLCALC 103 (H) 11/06/2021   Lab Results  Component Value Date   TRIG 85.0 11/06/2021   Lab Results  Component Value Date   CHOLHDL 3 11/06/2021   Lab Results  Component Value Date   HGBA1C 5.7 (H) 09/18/2018       Assessment & Plan:  Preventative health care Assessment & Plan: Ghm utd  Check labs  See AVS Health Maintenance  Topic Date Due   COVID-19 Vaccine (3 - 2025-26 season) 11/18/2024 (Originally 06/29/2024)   Influenza Vaccine  01/26/2025 (Originally 05/29/2024)   Mammogram  11/03/2025   Colonoscopy  02/26/2026   DTaP/Tdap/Td (3 - Td or Tdap) 11/07/2031    Pneumococcal Vaccine: 50+ Years  Completed   HPV VACCINES (No Doses Required) Completed   Hepatitis C Screening  Completed   HIV Screening  Completed   Zoster Vaccines- Shingrix   Completed   Hepatitis B Vaccines 19-59 Average Risk  Aged Out   Meningococcal B Vaccine  Aged Out     Orders: -     CBC with Differential/Platelet -     Comprehensive metabolic panel with GFR -     Lipid panel -     TSH  Need for pneumococcal 20-valent conjugate vaccination -     Pneumococcal conjugate vaccine 20-valent  OSA (obstructive sleep apnea) Assessment & Plan: Mild-- on no cpap   Primary hypertension Assessment & Plan: Well controlled, no changes to meds. Encouraged heart healthy diet such as the DASH diet and exercise as tolerated.  Assessment and Plan Assessment & Plan Pneumococcal vaccination   She is eligible for pneumococcal vaccination according to updated guidelines for individuals aged 33 and above.   Richey Doolittle R Lowne Chase, DO  "

## 2024-11-13 NOTE — Assessment & Plan Note (Signed)
 Ghm utd  Check labs  See AVS Health Maintenance  Topic Date Due   COVID-19 Vaccine (3 - 2025-26 season) 11/18/2024 (Originally 06/29/2024)   Influenza Vaccine  01/26/2025 (Originally 05/29/2024)   Mammogram  11/03/2025   Colonoscopy  02/26/2026   DTaP/Tdap/Td (3 - Td or Tdap) 11/07/2031   Pneumococcal Vaccine: 50+ Years  Completed   HPV VACCINES (No Doses Required) Completed   Hepatitis C Screening  Completed   HIV Screening  Completed   Zoster Vaccines- Shingrix   Completed   Hepatitis B Vaccines 19-59 Average Risk  Aged Out   Meningococcal B Vaccine  Aged Out

## 2024-11-13 NOTE — Assessment & Plan Note (Signed)
 Well controlled, no changes to meds. Encouraged heart healthy diet such as the DASH diet and exercise as tolerated.

## 2024-11-16 ENCOUNTER — Ambulatory Visit: Payer: Self-pay | Admitting: Family Medicine

## 2025-11-16 ENCOUNTER — Encounter: Admitting: Family Medicine
